# Patient Record
Sex: Male | Born: 1945 | Race: Black or African American | Hispanic: No
Health system: Southern US, Community
[De-identification: ages and names within clinical notes are randomized; demographics above are authoritative.]

## PROBLEM LIST (undated history)

## (undated) DIAGNOSIS — J9621 Acute and chronic respiratory failure with hypoxia: Secondary | ICD-10-CM

## (undated) DIAGNOSIS — R6520 Severe sepsis without septic shock: Secondary | ICD-10-CM

## (undated) DIAGNOSIS — Z930 Tracheostomy status: Secondary | ICD-10-CM

## (undated) DIAGNOSIS — G8251 Quadriplegia, C1-C4 complete: Secondary | ICD-10-CM

## (undated) DIAGNOSIS — A419 Sepsis, unspecified organism: Secondary | ICD-10-CM

---

## 2020-09-08 ENCOUNTER — Inpatient Hospital Stay
Admission: RE | Admit: 2020-09-08 | Discharge: 2020-10-27 | Disposition: A | Discharge status: Skilled Nursing Facility | Payer: Medicare Other | Source: Other Acute Inpatient Hospital | Attending: Internal Medicine | Admitting: Internal Medicine

## 2020-09-08 ENCOUNTER — Other Ambulatory Visit (HOSPITAL_COMMUNITY): Payer: Medicare Other

## 2020-09-08 DIAGNOSIS — G8251 Quadriplegia, C1-C4 complete: Secondary | ICD-10-CM | POA: Diagnosis present

## 2020-09-08 DIAGNOSIS — A419 Sepsis, unspecified organism: Secondary | ICD-10-CM | POA: Diagnosis present

## 2020-09-08 DIAGNOSIS — Z930 Tracheostomy status: Secondary | ICD-10-CM

## 2020-09-08 DIAGNOSIS — J9690 Respiratory failure, unspecified, unspecified whether with hypoxia or hypercapnia: Secondary | ICD-10-CM

## 2020-09-08 DIAGNOSIS — J9621 Acute and chronic respiratory failure with hypoxia: Secondary | ICD-10-CM | POA: Diagnosis present

## 2020-09-08 DIAGNOSIS — R6520 Severe sepsis without septic shock: Secondary | ICD-10-CM | POA: Diagnosis present

## 2020-09-08 HISTORY — DX: Sepsis, unspecified organism: A41.9

## 2020-09-08 HISTORY — DX: Quadriplegia, C1-C4 complete: G82.51

## 2020-09-08 HISTORY — DX: Sepsis, unspecified organism: R65.20

## 2020-09-08 HISTORY — DX: Acute and chronic respiratory failure with hypoxia: J96.21

## 2020-09-08 HISTORY — DX: Tracheostomy status: Z93.0

## 2020-09-08 MED ORDER — IOHEXOL 300 MG/ML  SOLN
50.0000 mL | Freq: Once | INTRAMUSCULAR | Status: AC | PRN
  Administered 2020-09-08: 50 mL

## 2020-09-09 DIAGNOSIS — G8251 Quadriplegia, C1-C4 complete: Secondary | ICD-10-CM | POA: Diagnosis not present

## 2020-09-09 DIAGNOSIS — A419 Sepsis, unspecified organism: Secondary | ICD-10-CM

## 2020-09-09 DIAGNOSIS — J9621 Acute and chronic respiratory failure with hypoxia: Secondary | ICD-10-CM | POA: Diagnosis not present

## 2020-09-09 DIAGNOSIS — Z930 Tracheostomy status: Secondary | ICD-10-CM

## 2020-09-09 DIAGNOSIS — R6520 Severe sepsis without septic shock: Secondary | ICD-10-CM

## 2020-09-09 LAB — BLOOD GAS, ARTERIAL
Acid-Base Excess: 3.4 mmol/L — ABNORMAL HIGH (ref 0.0–2.0)
Bicarbonate: 27 mmol/L (ref 20.0–28.0)
FIO2: 28
O2 Saturation: 96.2 %
Patient temperature: 36
pCO2 arterial: 36.3 mmHg (ref 32.0–48.0)
pH, Arterial: 7.479 — ABNORMAL HIGH (ref 7.350–7.450)
pO2, Arterial: 75.6 mmHg — ABNORMAL LOW (ref 83.0–108.0)

## 2020-09-09 LAB — COMPREHENSIVE METABOLIC PANEL
ALT: 18 U/L (ref 0–44)
AST: 26 U/L (ref 15–41)
Albumin: 1.2 g/dL — ABNORMAL LOW (ref 3.5–5.0)
Alkaline Phosphatase: 72 U/L (ref 38–126)
Anion gap: 7 (ref 5–15)
BUN: 12 mg/dL (ref 8–23)
CO2: 26 mmol/L (ref 22–32)
Calcium: 7.9 mg/dL — ABNORMAL LOW (ref 8.9–10.3)
Chloride: 100 mmol/L (ref 98–111)
Creatinine, Ser: 0.5 mg/dL — ABNORMAL LOW (ref 0.61–1.24)
GFR, Estimated: >60 mL/min (ref >60)
Glucose, Bld: 127 mg/dL — ABNORMAL HIGH (ref 70–99)
Potassium: 3.7 mmol/L (ref 3.5–5.1)
Sodium: 133 mmol/L — ABNORMAL LOW (ref 135–145)
Total Bilirubin: 0.6 mg/dL (ref 0.3–1.2)
Total Protein: 5.7 g/dL — ABNORMAL LOW (ref 6.5–8.1)

## 2020-09-09 LAB — CBC
HCT: 29.9 % — ABNORMAL LOW (ref 39.0–52.0)
Hemoglobin: 9.3 g/dL — ABNORMAL LOW (ref 13.0–17.0)
MCH: 23.4 pg — ABNORMAL LOW (ref 26.0–34.0)
MCHC: 31.1 g/dL (ref 30.0–36.0)
MCV: 75.1 fL — ABNORMAL LOW (ref 80.0–100.0)
Platelets: 283 10*3/uL (ref 150–400)
RBC: 3.98 MIL/uL — ABNORMAL LOW (ref 4.22–5.81)
RDW: 18.9 % — ABNORMAL HIGH (ref 11.5–15.5)
WBC: 7.1 10*3/uL (ref 4.0–10.5)
nRBC: 0 % (ref 0.0–0.2)

## 2020-09-09 LAB — URINALYSIS, ROUTINE W REFLEX MICROSCOPIC
Bilirubin Urine: NEGATIVE
Glucose, UA: NEGATIVE mg/dL
Ketones, ur: NEGATIVE mg/dL
Nitrite: NEGATIVE
Protein, ur: NEGATIVE mg/dL
Specific Gravity, Urine: 1.02 (ref 1.005–1.030)
pH: 8 (ref 5.0–8.0)

## 2020-09-09 LAB — URINALYSIS, MICROSCOPIC (REFLEX): Squamous Epithelial / HPF: NONE SEEN (ref 0–5)

## 2020-09-09 LAB — PROTIME-INR
INR: 1.3 — ABNORMAL HIGH (ref 0.8–1.2)
Prothrombin Time: 15.6 seconds — ABNORMAL HIGH (ref 11.4–15.2)

## 2020-09-09 LAB — TSH: TSH: 7.435 u[IU]/mL — ABNORMAL HIGH (ref 0.350–4.500)

## 2020-09-09 LAB — APTT: aPTT: 38 seconds — ABNORMAL HIGH (ref 24–36)

## 2020-09-09 NOTE — Consult Note (Signed)
Pulmonary Critical Care Medicine [facility] [place]  PULMONARY SERVICE  Date of Service: 09/09/2020  PULMONARY CRITICAL CARE CONSULT   Anthony Melton  EBX:[id number]  DOB: Jul 25, 1946   DOA: 09/08/2020  Referring Physician: Carron Curie, MD  HPI: Anthony Melton is a 75 y.o. male seen for follow up of Acute on Chronic Respiratory Failure.  Patient has multiple medical problems including hyperlipidemia hypertension came into the hospital because of weakness and shortness of breath.  The patient had blood cultures at that time which were positive and so was started on vancomycin and cefepime.  Respiratory status continued to decline ended up intubated on mechanical ventilation.  Patient was found to have an epidural abscess and patient underwent a laminectomy of the cervical spine with removal of the abscess back in January.  Subsequently patient was not able to wean off the ventilator and had to have a tracheostomy done.  Subsequently also patient had a PEG tube for anticipated long-term feeding.  Now transferred to our facility for further management  Review of Systems:  ROS performed and is unremarkable other than noted above.  Past medical history: Hypertension Hyperlipidemia Epidural abscess Quadriplegia Dysphagia   Past surgical history: Tracheostomy PEG  Family History: Non-Contributory to the present illness    Medications: Reviewed on Rounds  Physical Exam:  Vitals: Temperature is 96.1 pulse 58 respiratory 18 blood pressure is 107/69 saturations 97%  Ventilator Settings on T collar with an FiO2 of 28%  . General: Comfortable at this time . Eyes: Grossly normal lids, irises & conjunctiva . ENT: grossly tongue is normal . Neck: no obvious mass . Cardiovascular: S1-S2 normal no gallop or rub . Respiratory: Coarse breath sounds with few scattered rhonchi . Abdomen: Soft nontender . Skin: no rash seen on limited exam . Musculoskeletal: not  rigid . Psychiatric:unable to assess . Neurologic: no seizure no involuntary movements         Labs on Admission:  Basic Metabolic Panel: Recent Labs  Lab 09/09/20 0546  NA 133*  K 3.7  CL 100  CO2 26  GLUCOSE 127*  BUN 12  CREATININE 0.50*  CALCIUM 7.9*    Recent Labs  Lab 09/09/20 0845  PHART 7.479*  PCO2ART 36.3  PO2ART 75.6*  HCO3 27.0  O2SAT 96.2    Liver Function Tests: Recent Labs  Lab 09/09/20 0546  AST 26  ALT 18  ALKPHOS 72  BILITOT 0.6  PROT 5.7*  ALBUMIN 1.2*   No results for input(s): LIPASE, AMYLASE in the last 168 hours. No results for input(s): AMMONIA in the last 168 hours.  CBC: Recent Labs  Lab 09/09/20 0546  WBC 7.1  HGB 9.3*  HCT 29.9*  MCV 75.1*  PLT 283    Cardiac Enzymes: No results for input(s): CKTOTAL, CKMB, CKMBINDEX, TROPONINI in the last 168 hours.  BNP (last 3 results) No results for input(s): BNP in the last 8760 hours.  ProBNP (last 3 results) No results for input(s): PROBNP in the last 8760 hours.   Radiological Exams on Admission: DG ABDOMEN PEG TUBE LOCATION  Result Date: 09/08/2020 CLINICAL DATA:  Peg placement. EXAM: ABDOMEN - 1 VIEW COMPARISON:  None. FINDINGS: Portable AP supine view of the abdomen obtained after the installation of contrast through indwelling gastrostomy tube. Type and amount of contrast not specified. Contrast opacifies the gastrostomy tubing and stomach. Contrast does not extend into the duodenum or small bowel. There is no evidence of extravasation or leak. Air within nondilated bowel throughout  the central abdomen. Foley catheter versus rectal tube projects over the pelvis. IMPRESSION: Gastrostomy tube in the stomach. No evidence of extravasation or leak. Electronically Signed   By: Narda Rutherford M.D.   On: 09/08/2020 22:42   DG CHEST PORT 1 VIEW  Result Date: 09/08/2020 CLINICAL DATA:  New admission. No additional history provided or available. EXAM: PORTABLE CHEST 1 VIEW  COMPARISON:  None. FINDINGS: Tracheostomy tube tip at the thoracic inlet. There is a left upper extremity PICC in place. The tip is looped in the region of the brachiocephalic confluence. Overall low lung volumes. Cardiomegaly. Calcified mediastinal and left hilar nodes. Calcified granuloma at the left lung base. Hazy bilateral lung base opacities likely combination of pleural effusions and airspace disease and/or atelectasis. No pneumothorax. Thoracic spondylosis. IMPRESSION: 1. Tracheostomy tube tip at the thoracic inlet. 2. Left upper extremity PICC in place, tip looped in the region of the brachiocephalic confluence. 3. Cardiomegaly with hazy bilateral lung base opacities likely combination of pleural effusions and airspace disease and/or atelectasis. Electronically Signed   By: Narda Rutherford M.D.   On: 09/08/2020 22:41    Assessment/Plan Active Problems:   Acute on chronic respiratory failure with hypoxia (HCC)   Severe sepsis (HCC)   Quadriplegia, C1-C4 complete (HCC)   Tracheostomy status (HCC)   1. Acute on chronic respiratory failure hypoxia we will continue with T collar trials patient currently is on 28% FiO2 secretions refer to monitor patient's doing well.  Right now the patient had a #10 trach in place ABG that was done looks good spoke with respiratory therapy to go ahead and proceed to downsize the tracheostomy.  The patient had been apparently remain off the ventilator at the other facility so I think they will go ahead and do 24-hour 2. Severe sepsis patient has diagnosis of epidural abscess was treated with antibiotics and patient is supposed to go through March 25 for antibiotics. 3. Quadriplegia supportive care therapy as tolerated patient will require therapy. 4. Tracheostomy status with patient's quadriplegia unclear right now whether we will be able to decannulate  I have personally seen and evaluated the patient, evaluated laboratory and imaging results, formulated the  assessment and plan and placed orders. The Patient requires high complexity decision making with multiple systems involvement.  Case was discussed on Rounds with the Respiratory Therapy Director and the Respiratory staff Time Spent  Yevonne Pax, MD [facility] Pulmonary Critical Care Medicine Sleep Medicine

## 2020-09-10 DIAGNOSIS — Z930 Tracheostomy status: Secondary | ICD-10-CM | POA: Diagnosis not present

## 2020-09-10 DIAGNOSIS — J9621 Acute and chronic respiratory failure with hypoxia: Secondary | ICD-10-CM | POA: Diagnosis not present

## 2020-09-10 DIAGNOSIS — A419 Sepsis, unspecified organism: Secondary | ICD-10-CM | POA: Diagnosis not present

## 2020-09-10 DIAGNOSIS — G8251 Quadriplegia, C1-C4 complete: Secondary | ICD-10-CM | POA: Diagnosis not present

## 2020-09-10 LAB — BASIC METABOLIC PANEL
Anion gap: 9 (ref 5–15)
BUN: 12 mg/dL (ref 8–23)
CO2: 26 mmol/L (ref 22–32)
Calcium: 8 mg/dL — ABNORMAL LOW (ref 8.9–10.3)
Chloride: 100 mmol/L (ref 98–111)
Creatinine, Ser: 0.58 mg/dL — ABNORMAL LOW (ref 0.61–1.24)
GFR, Estimated: >60 mL/min (ref >60)
Glucose, Bld: 107 mg/dL — ABNORMAL HIGH (ref 70–99)
Potassium: 3.6 mmol/L (ref 3.5–5.1)
Sodium: 135 mmol/L (ref 135–145)

## 2020-09-10 NOTE — Progress Notes (Signed)
Pulmonary Critical Care Medicine [facility] [place]   PULMONARY CRITICAL CARE SERVICE  PROGRESS NOTE  Date of Service: 09/10/2020  Anthony Melton  OMB:[id number]  DOB: 1946-04-14   DOA: 09/08/2020  Referring Physician: Carron Curie, MD  HPI: Anthony Melton is a 75 y.o. male seen for follow up of Acute on Chronic Respiratory Failure.  Patient is on T collar has been on 28% FiO2 good saturations are noted at this time.  Medications: Reviewed on Rounds  Physical Exam:  Vitals: Temperature is 97.4 pulse 70 respiratory 25 blood pressure is 93/61 saturations 97%  Ventilator Settings on T collar with an FiO2 28%  . General: Comfortable at this time . Eyes: Grossly normal lids, irises & conjunctiva . ENT: grossly tongue is normal . Neck: no obvious mass . Cardiovascular: S1 S2 normal no gallop . Respiratory: Coarse breath sounds is few few rhonchi . Abdomen: soft . Skin: no rash seen on limited exam . Musculoskeletal: not rigid . Psychiatric:unable to assess . Neurologic: no seizure no involuntary movements         Lab Data:   Basic Metabolic Panel: Recent Labs  Lab 09/09/20 0546 09/10/20 0408  NA 133* 135  K 3.7 3.6  CL 100 100  CO2 26 26  GLUCOSE 127* 107*  BUN 12 12  CREATININE 0.50* 0.58*  CALCIUM 7.9* 8.0*    ABG: Recent Labs  Lab 09/09/20 0845  PHART 7.479*  PCO2ART 36.3  PO2ART 75.6*  HCO3 27.0  O2SAT 96.2    Liver Function Tests: Recent Labs  Lab 09/09/20 0546  AST 26  ALT 18  ALKPHOS 72  BILITOT 0.6  PROT 5.7*  ALBUMIN 1.2*   No results for input(s): LIPASE, AMYLASE in the last 168 hours. No results for input(s): AMMONIA in the last 168 hours.  CBC: Recent Labs  Lab 09/09/20 0546  WBC 7.1  HGB 9.3*  HCT 29.9*  MCV 75.1*  PLT 283    Cardiac Enzymes: No results for input(s): CKTOTAL, CKMB, CKMBINDEX, TROPONINI in the last 168 hours.  BNP (last 3 results) No results for input(s): BNP in the last 8760  hours.  ProBNP (last 3 results) No results for input(s): PROBNP in the last 8760 hours.  Radiological Exams: DG ABDOMEN PEG TUBE LOCATION  Result Date: 09/08/2020 CLINICAL DATA:  Peg placement. EXAM: ABDOMEN - 1 VIEW COMPARISON:  None. FINDINGS: Portable AP supine view of the abdomen obtained after the installation of contrast through indwelling gastrostomy tube. Type and amount of contrast not specified. Contrast opacifies the gastrostomy tubing and stomach. Contrast does not extend into the duodenum or small bowel. There is no evidence of extravasation or leak. Air within nondilated bowel throughout the central abdomen. Foley catheter versus rectal tube projects over the pelvis. IMPRESSION: Gastrostomy tube in the stomach. No evidence of extravasation or leak. Electronically Signed   By: Narda Rutherford M.D.   On: 09/08/2020 22:42   DG CHEST PORT 1 VIEW  Result Date: 09/08/2020 CLINICAL DATA:  New admission. No additional history provided or available. EXAM: PORTABLE CHEST 1 VIEW COMPARISON:  None. FINDINGS: Tracheostomy tube tip at the thoracic inlet. There is a left upper extremity PICC in place. The tip is looped in the region of the brachiocephalic confluence. Overall low lung volumes. Cardiomegaly. Calcified mediastinal and left hilar nodes. Calcified granuloma at the left lung base. Hazy bilateral lung base opacities likely combination of pleural effusions and airspace disease and/or atelectasis. No pneumothorax. Thoracic spondylosis. IMPRESSION: 1.  Tracheostomy tube tip at the thoracic inlet. 2. Left upper extremity PICC in place, tip looped in the region of the brachiocephalic confluence. 3. Cardiomegaly with hazy bilateral lung base opacities likely combination of pleural effusions and airspace disease and/or atelectasis. Electronically Signed   By: Narda Rutherford M.D.   On: 09/08/2020 22:41    Assessment/Plan Active Problems:   Acute on chronic respiratory failure with hypoxia (HCC)    Severe sepsis (HCC)   Quadriplegia, C1-C4 complete (HCC)   Tracheostomy status (HCC)   1. Acute on chronic respiratory failure hypoxia we will continue with T collar titrate oxygen continue pulmonary toilet. 2. Severe sepsis treated patient is going to be on long-term antibiotics 3. Quadriplegia supportive care therapy as tolerated 4. Tracheostomy will remain in place for now   I have personally seen and evaluated the patient, evaluated laboratory and imaging results, formulated the assessment and plan and placed orders. The Patient requires high complexity decision making with multiple systems involvement.  Rounds were done with the Respiratory Therapy Director and Staff therapists and discussed with nursing staff also.  Yevonne Pax, MD [facility] Pulmonary Critical Care Medicine Sleep Medicine

## 2020-09-11 DIAGNOSIS — G8251 Quadriplegia, C1-C4 complete: Secondary | ICD-10-CM | POA: Diagnosis not present

## 2020-09-11 DIAGNOSIS — J9621 Acute and chronic respiratory failure with hypoxia: Secondary | ICD-10-CM | POA: Diagnosis not present

## 2020-09-11 DIAGNOSIS — Z930 Tracheostomy status: Secondary | ICD-10-CM | POA: Diagnosis not present

## 2020-09-11 DIAGNOSIS — A419 Sepsis, unspecified organism: Secondary | ICD-10-CM | POA: Diagnosis not present

## 2020-09-11 LAB — CK: Total CK: 138 U/L (ref 49–397)

## 2020-09-11 NOTE — Progress Notes (Signed)
Pulmonary Critical Care Medicine [facility] [place]   PULMONARY CRITICAL CARE SERVICE  PROGRESS NOTE  Date of Service: 09/11/2020  Anthony Melton  QIW:[id number]  DOB: 1945-08-24   DOA: 09/08/2020  Referring Physician: Carron Curie, MD  HPI: Anthony Melton is a 75 y.o. male seen for follow up of Acute on Chronic Respiratory Failure.  Patient right now is on T collar has been on 28% FiO2.  Medications: Reviewed on Rounds  Physical Exam:  Vitals: Temperature is 97.8 pulse 61 respiratory rate 20 blood pressure is 124/65 saturations 98%  Ventilator Settings on T collar with an FiO2 28%  . General: Comfortable at this time . Eyes: Grossly normal lids, irises & conjunctiva . ENT: grossly tongue is normal . Neck: no obvious mass . Cardiovascular: S1 S2 normal no gallop . Respiratory: Scattered rhonchi no rales . Abdomen: soft . Skin: no rash seen on limited exam . Musculoskeletal: not rigid . Psychiatric:unable to assess . Neurologic: no seizure no involuntary movements         Lab Data:   Basic Metabolic Panel: Recent Labs  Lab 09/09/20 0546 09/10/20 0408  NA 133* 135  K 3.7 3.6  CL 100 100  CO2 26 26  GLUCOSE 127* 107*  BUN 12 12  CREATININE 0.50* 0.58*  CALCIUM 7.9* 8.0*    ABG: Recent Labs  Lab 09/09/20 0845  PHART 7.479*  PCO2ART 36.3  PO2ART 75.6*  HCO3 27.0  O2SAT 96.2    Liver Function Tests: Recent Labs  Lab 09/09/20 0546  AST 26  ALT 18  ALKPHOS 72  BILITOT 0.6  PROT 5.7*  ALBUMIN 1.2*   No results for input(s): LIPASE, AMYLASE in the last 168 hours. No results for input(s): AMMONIA in the last 168 hours.  CBC: Recent Labs  Lab 09/09/20 0546  WBC 7.1  HGB 9.3*  HCT 29.9*  MCV 75.1*  PLT 283    Cardiac Enzymes: No results for input(s): CKTOTAL, CKMB, CKMBINDEX, TROPONINI in the last 168 hours.  BNP (last 3 results) No results for input(s): BNP in the last 8760 hours.  ProBNP (last 3 results) No results  for input(s): PROBNP in the last 8760 hours.  Radiological Exams: No results found.  Assessment/Plan Active Problems:   Acute on chronic respiratory failure with hypoxia (HCC)   Severe sepsis (HCC)   Quadriplegia, C1-C4 complete (HCC)   Tracheostomy status (HCC)   1. Acute on chronic respiratory failure with hypoxia we will continue T collar trials titrate oxygen as tolerated continue secretion management pulmonary toilet. 2. Severe sepsis treated in resolution 3. Quadriplegia supportive care 4. Tracheostomy will remain in place for secretion management for now   I have personally seen and evaluated the patient, evaluated laboratory and imaging results, formulated the assessment and plan and placed orders. The Patient requires high complexity decision making with multiple systems involvement.  Rounds were done with the Respiratory Therapy Director and Staff therapists and discussed with nursing staff also.  Yevonne Pax, MD [facility] Pulmonary Critical Care Medicine Sleep Medicine

## 2020-09-12 DIAGNOSIS — G8251 Quadriplegia, C1-C4 complete: Secondary | ICD-10-CM | POA: Diagnosis not present

## 2020-09-12 DIAGNOSIS — A419 Sepsis, unspecified organism: Secondary | ICD-10-CM | POA: Diagnosis not present

## 2020-09-12 DIAGNOSIS — J9621 Acute and chronic respiratory failure with hypoxia: Secondary | ICD-10-CM | POA: Diagnosis not present

## 2020-09-12 DIAGNOSIS — Z930 Tracheostomy status: Secondary | ICD-10-CM | POA: Diagnosis not present

## 2020-09-12 NOTE — Progress Notes (Signed)
Pulmonary Critical Care Medicine [facility] [place]   PULMONARY CRITICAL CARE SERVICE  PROGRESS NOTE  Date of Service: 09/12/2020  Anthony Melton  [id number]  DOB: 12/13/1945   DOA: 09/08/2020  Referring Physician: Carron Curie, MD  HPI: Anthony Melton is a 75 y.o. male seen for follow up of Acute on Chronic Respiratory Failure.  Patient currently is on T collar has been on 28% FiO2 at this time  Medications: Reviewed on Rounds  Physical Exam:  Vitals: Temperature is 98.0 pulse 73 respiratory rate is 31 blood pressure is 102/62 saturations 98%  Ventilator Settings on T collar with an FiO2 of 35%  . General: Comfortable at this time . Eyes: Grossly normal lids, irises & conjunctiva . ENT: grossly tongue is normal . Neck: no obvious mass . Cardiovascular: S1 S2 normal no gallop . Respiratory: Scattered rhonchi expansion is equal at this time . Abdomen: soft . Skin: no rash seen on limited exam . Musculoskeletal: not rigid . Psychiatric:unable to assess . Neurologic: no seizure no involuntary movements         Lab Data:   Basic Metabolic Panel: Recent Labs  Lab 09/09/20 0546 09/10/20 0408  NA 133* 135  K 3.7 3.6  CL 100 100  CO2 26 26  GLUCOSE 127* 107*  BUN 12 12  CREATININE 0.50* 0.58*  CALCIUM 7.9* 8.0*    ABG: Recent Labs  Lab 09/09/20 0845  PHART 7.479*  PCO2ART 36.3  PO2ART 75.6*  HCO3 27.0  O2SAT 96.2    Liver Function Tests: Recent Labs  Lab 09/09/20 0546  AST 26  ALT 18  ALKPHOS 72  BILITOT 0.6  PROT 5.7*  ALBUMIN 1.2*   No results for input(s): LIPASE, AMYLASE in the last 168 hours. No results for input(s): AMMONIA in the last 168 hours.  CBC: Recent Labs  Lab 09/09/20 0546  WBC 7.1  HGB 9.3*  HCT 29.9*  MCV 75.1*  PLT 283    Cardiac Enzymes: Recent Labs  Lab 09/11/20 0954  CKTOTAL 138    BNP (last 3 results) No results for input(s): BNP in the last 8760 hours.  ProBNP (last 3 results) No  results for input(s): PROBNP in the last 8760 hours.  Radiological Exams: No results found.  Assessment/Plan Active Problems:   Acute on chronic respiratory failure with hypoxia (HCC)   Severe sepsis (HCC)   Quadriplegia, C1-C4 complete (HCC)   Tracheostomy status (HCC)   1. Acute on chronic respiratory failure hypoxia plan is to continue with the weaning on T collar.  Titrate oxygen continue pulmonary toilet as tolerated. 2. Quadriplegia therapy as tolerated. 3. Severe sepsis treated in resolution long-term antibiotics planned 4. Tracheostomy will remain in place   I have personally seen and evaluated the patient, evaluated laboratory and imaging results, formulated the assessment and plan and placed orders. The Patient requires high complexity decision making with multiple systems involvement.  Rounds were done with the Respiratory Therapy Director and Staff therapists and discussed with nursing staff also.  Yevonne Pax, MD [facility] Pulmonary Critical Care Medicine Sleep Medicine

## 2020-09-13 DIAGNOSIS — Z930 Tracheostomy status: Secondary | ICD-10-CM | POA: Diagnosis not present

## 2020-09-13 DIAGNOSIS — J9621 Acute and chronic respiratory failure with hypoxia: Secondary | ICD-10-CM | POA: Diagnosis not present

## 2020-09-13 DIAGNOSIS — A419 Sepsis, unspecified organism: Secondary | ICD-10-CM | POA: Diagnosis not present

## 2020-09-13 DIAGNOSIS — G8251 Quadriplegia, C1-C4 complete: Secondary | ICD-10-CM | POA: Diagnosis not present

## 2020-09-13 NOTE — Progress Notes (Signed)
Pulmonary Critical Care Medicine [facility] [place]   PULMONARY CRITICAL CARE SERVICE  PROGRESS NOTE  Date of Service: 09/13/2020  Anthony Melton  VOZ:[id number]  DOB: Nov 06, 1945   DOA: 09/08/2020  Referring Physician: Carron Curie, MD  HPI: Anthony Melton is a 75 y.o. male seen for follow up of Acute on Chronic Respiratory Failure.  Patient currently is on T collar has been on 28% FiO2 with good saturations.  Medications: Reviewed on Rounds  Physical Exam:  Vitals: Temperature is 98.2 pulse 69 respiratory 24 blood pressure is 124/92 saturations 100%  Ventilator Settings on T collar with an FiO2 28%  . General: Comfortable at this time . Eyes: Grossly normal lids, irises & conjunctiva . ENT: grossly tongue is normal . Neck: no obvious mass . Cardiovascular: S1 S2 normal no gallop . Respiratory: Coarse rhonchi expansion is equal . Abdomen: soft . Skin: no rash seen on limited exam . Musculoskeletal: not rigid . Psychiatric:unable to assess . Neurologic: no seizure no involuntary movements         Lab Data:   Basic Metabolic Panel: Recent Labs  Lab 09/09/20 0546 09/10/20 0408  NA 133* 135  K 3.7 3.6  CL 100 100  CO2 26 26  GLUCOSE 127* 107*  BUN 12 12  CREATININE 0.50* 0.58*  CALCIUM 7.9* 8.0*    ABG: Recent Labs  Lab 09/09/20 0845  PHART 7.479*  PCO2ART 36.3  PO2ART 75.6*  HCO3 27.0  O2SAT 96.2    Liver Function Tests: Recent Labs  Lab 09/09/20 0546  AST 26  ALT 18  ALKPHOS 72  BILITOT 0.6  PROT 5.7*  ALBUMIN 1.2*   No results for input(s): LIPASE, AMYLASE in the last 168 hours. No results for input(s): AMMONIA in the last 168 hours.  CBC: Recent Labs  Lab 09/09/20 0546  WBC 7.1  HGB 9.3*  HCT 29.9*  MCV 75.1*  PLT 283    Cardiac Enzymes: Recent Labs  Lab 09/11/20 0954  CKTOTAL 138    BNP (last 3 results) No results for input(s): BNP in the last 8760 hours.  ProBNP (last 3 results) No results for  input(s): PROBNP in the last 8760 hours.  Radiological Exams: No results found.  Assessment/Plan Active Problems:   Acute on chronic respiratory failure with hypoxia (HCC)   Severe sepsis (HCC)   Quadriplegia, C1-C4 complete (HCC)   Tracheostomy status (HCC)   1. Acute on chronic respiratory failure hypoxia we will continue with T collar trials titrate oxygen continue pulmonary toilet secretions are fair to moderate 2. Severe sepsis treated in resolution 3. Tracheostomy remains in place 4. Quadriplegia supportive care   I have personally seen and evaluated the patient, evaluated laboratory and imaging results, formulated the assessment and plan and placed orders. The Patient requires high complexity decision making with multiple systems involvement.  Rounds were done with the Respiratory Therapy Director and Staff therapists and discussed with nursing staff also.  Yevonne Pax, MD [facility] Pulmonary Critical Care Medicine Sleep Medicine

## 2020-09-14 DIAGNOSIS — J9621 Acute and chronic respiratory failure with hypoxia: Secondary | ICD-10-CM | POA: Diagnosis not present

## 2020-09-14 DIAGNOSIS — A419 Sepsis, unspecified organism: Secondary | ICD-10-CM | POA: Diagnosis not present

## 2020-09-14 DIAGNOSIS — G8251 Quadriplegia, C1-C4 complete: Secondary | ICD-10-CM | POA: Diagnosis not present

## 2020-09-14 DIAGNOSIS — Z930 Tracheostomy status: Secondary | ICD-10-CM | POA: Diagnosis not present

## 2020-09-14 NOTE — Progress Notes (Signed)
Pulmonary Critical Care Medicine [facility] [place]   PULMONARY CRITICAL CARE SERVICE  PROGRESS NOTE  Date of Service: 09/14/2020  Hector Taft  UYQ:[id number]  DOB: 1945/11/06   DOA: 09/08/2020  Referring Physician: Carron Curie, MD  HPI: Emran Molzahn is a 75 y.o. male seen for follow up of Acute on Chronic Respiratory Failure.  Patient at this time is on T collar copious secretions are noted limiting [place] from being able to advance weaning  Medications: Reviewed on Rounds  Physical Exam:  Vitals: Temperature is 96.9 pulse 59 respiratory 21 blood pressure is 103/58 saturations 100%  Ventilator Settings on T collar with an FiO2 of 28%  . General: Comfortable at this time . Eyes: Grossly normal lids, irises & conjunctiva . ENT: grossly tongue is normal . Neck: no obvious mass . Cardiovascular: S1 S2 normal no gallop . Respiratory: Scattered rhonchi expansion is equal . Abdomen: soft . Skin: no rash seen on limited exam . Musculoskeletal: not rigid . Psychiatric:unable to assess . Neurologic: no seizure no involuntary movements         Lab Data:   Basic Metabolic Panel: Recent Labs  Lab 09/09/20 0546 09/10/20 0408  NA 133* 135  K 3.7 3.6  CL 100 100  CO2 26 26  GLUCOSE 127* 107*  BUN 12 12  CREATININE 0.50* 0.58*  CALCIUM 7.9* 8.0*    ABG: Recent Labs  Lab 09/09/20 0845  PHART 7.479*  PCO2ART 36.3  PO2ART 75.6*  HCO3 27.0  O2SAT 96.2    Liver Function Tests: Recent Labs  Lab 09/09/20 0546  AST 26  ALT 18  ALKPHOS 72  BILITOT 0.6  PROT 5.7*  ALBUMIN 1.2*   No results for input(s): LIPASE, AMYLASE in the last 168 hours. No results for input(s): AMMONIA in the last 168 hours.  CBC: Recent Labs  Lab 09/09/20 0546  WBC 7.1  HGB 9.3*  HCT 29.9*  MCV 75.1*  PLT 283    Cardiac Enzymes: Recent Labs  Lab 09/11/20 0954  CKTOTAL 138    BNP (last 3 results) No results for input(s): BNP in the last 8760  hours.  ProBNP (last 3 results) No results for input(s): PROBNP in the last 8760 hours.  Radiological Exams: No results found.  Assessment/Plan Active Problems:   Acute on chronic respiratory failure with hypoxia (HCC)   Severe sepsis (HCC)   Quadriplegia, C1-C4 complete (HCC)   Tracheostomy status (HCC)   1. Acute on chronic respiratory failure hypoxia patient is on T collar copious secretions right and requiring 28% FiO2. 2. Severe sepsis resolved patient will remain on long-term antibiotics through March 25 3. Quadriplegia no change we will continue to follow 4. Tracheostomy remains in place   I have personally seen and evaluated the patient, evaluated laboratory and imaging results, formulated the assessment and plan and placed orders. The Patient requires high complexity decision making with multiple systems involvement.  Rounds were done with the Respiratory Therapy Director and Staff therapists and discussed with nursing staff also.  Yevonne Pax, MD [facility] Pulmonary Critical Care Medicine Sleep Medicine

## 2020-09-15 ENCOUNTER — Encounter: Payer: Self-pay | Admitting: Internal Medicine

## 2020-09-15 ENCOUNTER — Other Ambulatory Visit (HOSPITAL_COMMUNITY): Payer: Medicare Other

## 2020-09-15 DIAGNOSIS — J9621 Acute and chronic respiratory failure with hypoxia: Secondary | ICD-10-CM | POA: Diagnosis present

## 2020-09-15 DIAGNOSIS — R6520 Severe sepsis without septic shock: Secondary | ICD-10-CM | POA: Diagnosis present

## 2020-09-15 DIAGNOSIS — G8251 Quadriplegia, C1-C4 complete: Secondary | ICD-10-CM | POA: Diagnosis not present

## 2020-09-15 DIAGNOSIS — Z930 Tracheostomy status: Secondary | ICD-10-CM | POA: Diagnosis not present

## 2020-09-15 DIAGNOSIS — A419 Sepsis, unspecified organism: Secondary | ICD-10-CM | POA: Diagnosis not present

## 2020-09-15 LAB — BASIC METABOLIC PANEL
Anion gap: 9 (ref 5–15)
BUN: 16 mg/dL (ref 8–23)
CO2: 27 mmol/L (ref 22–32)
Calcium: 8.6 mg/dL — ABNORMAL LOW (ref 8.9–10.3)
Chloride: 102 mmol/L (ref 98–111)
Creatinine, Ser: 0.51 mg/dL — ABNORMAL LOW (ref 0.61–1.24)
GFR, Estimated: >60 mL/min (ref >60)
Glucose, Bld: 132 mg/dL — ABNORMAL HIGH (ref 70–99)
Potassium: 3.4 mmol/L — ABNORMAL LOW (ref 3.5–5.1)
Sodium: 138 mmol/L (ref 135–145)

## 2020-09-15 LAB — CBC
HCT: 32.7 % — ABNORMAL LOW (ref 39.0–52.0)
Hemoglobin: 10.1 g/dL — ABNORMAL LOW (ref 13.0–17.0)
MCH: 23.4 pg — ABNORMAL LOW (ref 26.0–34.0)
MCHC: 30.9 g/dL (ref 30.0–36.0)
MCV: 75.9 fL — ABNORMAL LOW (ref 80.0–100.0)
Platelets: 253 10*3/uL (ref 150–400)
RBC: 4.31 MIL/uL (ref 4.22–5.81)
RDW: 20 % — ABNORMAL HIGH (ref 11.5–15.5)
WBC: 7.4 10*3/uL (ref 4.0–10.5)
nRBC: 0 % (ref 0.0–0.2)

## 2020-09-15 NOTE — Progress Notes (Signed)
Pulmonary Critical Care Medicine [facility] [place]   PULMONARY CRITICAL CARE SERVICE  PROGRESS NOTE  Date of Service: 09/15/2020  Anthony Melton  [id number]:[id number]  DOB: January 03, 1946   DOA: 09/08/2020  Referring Physician: Carron Curie, MD  HPI: Anthony Melton is a 75 y.o. male seen for follow up of Acute on Chronic Respiratory Failure.  Patient currently is on T collar 28% FiO2 seems to be doing well secretions are copious  Medications: Reviewed on Rounds  Physical Exam:  Vitals: Temperature is 95.6 pulse 52 respiratory 20 blood pressure is 137/85 saturations 99%  Ventilator Settings on T collar with an FiO2 of 28%  . General: Comfortable at this time . Eyes: Grossly normal lids, irises & conjunctiva . ENT: grossly tongue is normal . Neck: no obvious mass . Cardiovascular: S1 S2 normal no gallop . Respiratory: Scattered rhonchi expansion is equal . Abdomen: soft . Skin: no rash seen on limited exam . Musculoskeletal: not rigid . Psychiatric:unable to assess . Neurologic: no seizure no involuntary movements         Lab Data:   Basic Metabolic Panel: Recent Labs  Lab 09/09/20 0546 09/10/20 0408 09/15/20 0552  NA 133* 135 138  K 3.7 3.6 3.4*  CL 100 100 102  CO2 26 26 27   GLUCOSE 127* 107* 132*  BUN 12 12 16   CREATININE 0.50* 0.58* 0.51*  CALCIUM 7.9* 8.0* 8.6*    ABG: Recent Labs  Lab 09/09/20 0845  PHART 7.479*  PCO2ART 36.3  PO2ART 75.6*  HCO3 27.0  O2SAT 96.2    Liver Function Tests: Recent Labs  Lab 09/09/20 0546  AST 26  ALT 18  ALKPHOS 72  BILITOT 0.6  PROT 5.7*  ALBUMIN 1.2*   No results for input(s): LIPASE, AMYLASE in the last 168 hours. No results for input(s): AMMONIA in the last 168 hours.  CBC: Recent Labs  Lab 09/09/20 0546 09/15/20 0552  WBC 7.1 7.4  HGB 9.3* 10.1*  HCT 29.9* 32.7*  MCV 75.1* 75.9*  PLT 283 253    Cardiac Enzymes: Recent Labs  Lab 09/11/20 0954  CKTOTAL 138    BNP (last 3  results) No results for input(s): BNP in the last 8760 hours.  ProBNP (last 3 results) No results for input(s): PROBNP in the last 8760 hours.  Radiological Exams: DG CHEST PORT 1 VIEW  Result Date: 09/15/2020 CLINICAL DATA:  Congestion, wheezing and shortness of breath EXAM: PORTABLE CHEST 1 VIEW COMPARISON:  09/08/2020 FINDINGS: Tracheostomy tube tip is above the carina. There is a left arm PICC line with tip at the cavoatrial junction. Stable cardiomediastinal contours. Decreased lung volumes with subsegmental atelectasis within the left lung base atelectasis. Improved appearance of previous small pleural effusions. IMPRESSION: 1. Decrease in bilateral pleural effusions. 2. Persistent subsegmental atelectasis in the left base. Electronically Signed   By: 09/17/2020 M.D.   On: 09/15/2020 07:52    Assessment/Plan Active Problems:   Acute on chronic respiratory failure with hypoxia (HCC)   Severe sepsis (HCC)   Quadriplegia, C1-C4 complete (HCC)   Tracheostomy status (HCC)   1. Acute on chronic respiratory failure hypoxia we will continue with T collar trials titrate oxygen continue pulmonary toilet. 2. Severe sepsis treated resolved 3. Quadriplegia no change continue present management 4. Tracheostomy will remain in place for suction access   I have personally seen and evaluated the patient, evaluated laboratory and imaging results, formulated the assessment and plan and placed orders. The Patient requires high  complexity decision making with multiple systems involvement.  Rounds were done with the Respiratory Therapy Director and Staff therapists and discussed with nursing staff also.  Allyne Gee, MD [facility] Pulmonary Critical Care Medicine Sleep Medicine

## 2020-09-16 DIAGNOSIS — G8251 Quadriplegia, C1-C4 complete: Secondary | ICD-10-CM | POA: Diagnosis not present

## 2020-09-16 DIAGNOSIS — Z930 Tracheostomy status: Secondary | ICD-10-CM | POA: Diagnosis not present

## 2020-09-16 DIAGNOSIS — A419 Sepsis, unspecified organism: Secondary | ICD-10-CM | POA: Diagnosis not present

## 2020-09-16 DIAGNOSIS — J9621 Acute and chronic respiratory failure with hypoxia: Secondary | ICD-10-CM | POA: Diagnosis not present

## 2020-09-16 LAB — POTASSIUM: Potassium: 3.7 mmol/L (ref 3.5–5.1)

## 2020-09-16 NOTE — Progress Notes (Signed)
Pulmonary Critical Care Medicine [facility] [place]   PULMONARY CRITICAL CARE SERVICE  PROGRESS NOTE  Date of Service: 09/16/2020  Anthony Melton  ZOX:[id number]  DOB: 03-13-1946   DOA: 09/08/2020  Referring Physician: Carron Curie, MD  HPI: Anthony Melton is a 75 y.o. male seen for follow up of Acute on Chronic Respiratory Failure.  Patient currently is on T collar has been on 20% FiO2 using the PMV  Medications: Reviewed on Rounds  Physical Exam:  Vitals: Temperature 98.6 pulse 84 respiratory 20 blood pressure is 106/64 saturations 100%  Ventilator Settings on T collar with PMV  . General: Comfortable at this time . Eyes: Grossly normal lids, irises & conjunctiva . ENT: grossly tongue is normal . Neck: no obvious mass . Cardiovascular: S1 S2 normal no gallop . Respiratory: Scattered coarse breath sounds noted bilaterally . Abdomen: soft . Skin: no rash seen on limited exam . Musculoskeletal: not rigid . Psychiatric:unable to assess . Neurologic: no seizure no involuntary movements         Lab Data:   Basic Metabolic Panel: Recent Labs  Lab 09/10/20 0408 09/15/20 0552 09/16/20 0850  NA 135 138  --   K 3.6 3.4* 3.7  CL 100 102  --   CO2 26 27  --   GLUCOSE 107* 132*  --   BUN 12 16  --   CREATININE 0.58* 0.51*  --   CALCIUM 8.0* 8.6*  --     ABG: No results for input(s): PHART, PCO2ART, PO2ART, HCO3, O2SAT in the last 168 hours.  Liver Function Tests: No results for input(s): AST, ALT, ALKPHOS, BILITOT, PROT, ALBUMIN in the last 168 hours. No results for input(s): LIPASE, AMYLASE in the last 168 hours. No results for input(s): AMMONIA in the last 168 hours.  CBC: Recent Labs  Lab 09/15/20 0552  WBC 7.4  HGB 10.1*  HCT 32.7*  MCV 75.9*  PLT 253    Cardiac Enzymes: Recent Labs  Lab 09/11/20 0954  CKTOTAL 138    BNP (last 3 results) No results for input(s): BNP in the last 8760 hours.  ProBNP (last 3 results) No  results for input(s): PROBNP in the last 8760 hours.  Radiological Exams: DG CHEST PORT 1 VIEW  Result Date: 09/15/2020 CLINICAL DATA:  Congestion, wheezing and shortness of breath EXAM: PORTABLE CHEST 1 VIEW COMPARISON:  09/08/2020 FINDINGS: Tracheostomy tube tip is above the carina. There is a left arm PICC line with tip at the cavoatrial junction. Stable cardiomediastinal contours. Decreased lung volumes with subsegmental atelectasis within the left lung base atelectasis. Improved appearance of previous small pleural effusions. IMPRESSION: 1. Decrease in bilateral pleural effusions. 2. Persistent subsegmental atelectasis in the left base. Electronically Signed   By: Signa Kell M.D.   On: 09/15/2020 07:52    Assessment/Plan Active Problems:   Acute on chronic respiratory failure with hypoxia (HCC)   Severe sepsis (HCC)   Quadriplegia, C1-C4 complete (HCC)   Tracheostomy status (HCC)   1. Acute on chronic respiratory failure hypoxia we will continue with PMV in T collar as tolerated 2. Severe sepsis treated resolved 3. Quadriplegia no change 4. Tracheostomy patient is at baseline right now   I have personally seen and evaluated the patient, evaluated laboratory and imaging results, formulated the assessment and plan and placed orders. The Patient requires high complexity decision making with multiple systems involvement.  Rounds were done with the Respiratory Therapy Director and Staff therapists and discussed with nursing staff  also.  Allyne Gee, MD [facility] Pulmonary Critical Care Medicine Sleep Medicine

## 2020-09-17 DIAGNOSIS — G8251 Quadriplegia, C1-C4 complete: Secondary | ICD-10-CM | POA: Diagnosis not present

## 2020-09-17 DIAGNOSIS — A419 Sepsis, unspecified organism: Secondary | ICD-10-CM | POA: Diagnosis not present

## 2020-09-17 DIAGNOSIS — Z930 Tracheostomy status: Secondary | ICD-10-CM | POA: Diagnosis not present

## 2020-09-17 DIAGNOSIS — J9621 Acute and chronic respiratory failure with hypoxia: Secondary | ICD-10-CM | POA: Diagnosis not present

## 2020-09-17 LAB — CULTURE, RESPIRATORY W GRAM STAIN: Culture: NORMAL

## 2020-09-17 NOTE — Progress Notes (Signed)
Pulmonary Critical Care Medicine [facility] [place]   PULMONARY CRITICAL CARE SERVICE  PROGRESS NOTE  Date of Service: 09/17/2020  Anthony Melton  [id number]  DOB: [birth year]   DOA: 09/08/2020  Referring Physician: Carron Curie, MD  HPI: Anthony Melton is a 75 y.o. male seen for follow up of Acute on Chronic Respiratory Failure.  Patient currently is on T collar has been on 28% FiO2 with good saturations noted  Medications: Reviewed on Rounds  Physical Exam:  Vitals: Temperature is 100.1 pulse 88 respiratory rate is 24 blood pressure is 111/65 saturations 99%  Ventilator Settings off the ventilator on T collar FiO2 28%  . General: Comfortable at this time . Eyes: Grossly normal lids, irises & conjunctiva . ENT: grossly tongue is normal . Neck: no obvious mass . Cardiovascular: S1 S2 normal no gallop . Respiratory: Scattered rhonchi expansion is equal . Abdomen: soft . Skin: no rash seen on limited exam . Musculoskeletal: not rigid . Psychiatric:unable to assess . Neurologic: no seizure no involuntary movements         Lab Data:   Basic Metabolic Panel: Recent Labs  Lab 09/15/20 0552 09/16/20 0850  NA 138  --   K 3.4* 3.7  CL 102  --   CO2 27  --   GLUCOSE 132*  --   BUN 16  --   CREATININE 0.51*  --   CALCIUM 8.6*  --     ABG: No results for input(s): PHART, PCO2ART, PO2ART, HCO3, O2SAT in the last 168 hours.  Liver Function Tests: No results for input(s): AST, ALT, ALKPHOS, BILITOT, PROT, ALBUMIN in the last 168 hours. No results for input(s): LIPASE, AMYLASE in the last 168 hours. No results for input(s): AMMONIA in the last 168 hours.  CBC: Recent Labs  Lab 09/15/20 0552  WBC 7.4  HGB 10.1*  HCT 32.7*  MCV 75.9*  PLT 253    Cardiac Enzymes: Recent Labs  Lab 09/11/20 0954  CKTOTAL 138    BNP (last 3 results) No results for input(s): BNP in the last 8760 hours.  ProBNP (last 3 results) No results for input(s):  PROBNP in the last 8760 hours.  Radiological Exams: No results found.  Assessment/Plan Active Problems:   Acute on chronic respiratory failure with hypoxia (HCC)   Severe sepsis (HCC)   Quadriplegia, C1-C4 complete (HCC)   Tracheostomy status (HCC)   1. Acute on chronic respiratory failure hypoxia we will continue with the T-piece titrate oxygen as tolerated 2. Severe sepsis resolved 3. Quadriplegia no change 4. Tracheostomy will likely remain in place long-term   I have personally seen and evaluated the patient, evaluated laboratory and imaging results, formulated the assessment and plan and placed orders. The Patient requires high complexity decision making with multiple systems involvement.  Rounds were done with the Respiratory Therapy Director and Staff therapists and discussed with nursing staff also.  Yevonne Pax, MD [facility] Pulmonary Critical Care Medicine Sleep Medicine

## 2020-09-18 DIAGNOSIS — G8251 Quadriplegia, C1-C4 complete: Secondary | ICD-10-CM | POA: Diagnosis not present

## 2020-09-18 DIAGNOSIS — A419 Sepsis, unspecified organism: Secondary | ICD-10-CM | POA: Diagnosis not present

## 2020-09-18 DIAGNOSIS — J9621 Acute and chronic respiratory failure with hypoxia: Secondary | ICD-10-CM | POA: Diagnosis not present

## 2020-09-18 DIAGNOSIS — Z930 Tracheostomy status: Secondary | ICD-10-CM | POA: Diagnosis not present

## 2020-09-18 NOTE — Progress Notes (Signed)
Pulmonary Critical Care Medicine [facility] [place]   PULMONARY CRITICAL CARE SERVICE  PROGRESS NOTE  Date of Service: 09/18/2020  Anthony Melton  [id number]  DOB: Jul 13, 1946   DOA: 09/08/2020  Referring Physician: Carron Curie, MD  HPI: Anthony Melton is a 75 y.o. male seen for follow up of Acute on Chronic Respiratory Failure.  Patient currently is on T collar has been on 28% FiO2 with saturations of 96%  Medications: Reviewed on Rounds  Physical Exam:  Vitals: Temperature is 98.9 pulse 93 respiratory rate 17 blood pressure is 145/83 saturations 96%  Ventilator Settings on T collar with an FiO2 of 28%  . General: Comfortable at this time . Eyes: Grossly normal lids, irises & conjunctiva . ENT: grossly tongue is normal . Neck: no obvious mass . Cardiovascular: S1 S2 normal no gallop . Respiratory: Scattered rhonchi coarse breath sound . Abdomen: soft . Skin: no rash seen on limited exam . Musculoskeletal: not rigid . Psychiatric:unable to assess . Neurologic: no seizure no involuntary movements         Lab Data:   Basic Metabolic Panel: Recent Labs  Lab 09/15/20 0552 09/16/20 0850  NA 138  --   K 3.4* 3.7  CL 102  --   CO2 27  --   GLUCOSE 132*  --   BUN 16  --   CREATININE 0.51*  --   CALCIUM 8.6*  --     ABG: No results for input(s): PHART, PCO2ART, PO2ART, HCO3, O2SAT in the last 168 hours.  Liver Function Tests: No results for input(s): AST, ALT, ALKPHOS, BILITOT, PROT, ALBUMIN in the last 168 hours. No results for input(s): LIPASE, AMYLASE in the last 168 hours. No results for input(s): AMMONIA in the last 168 hours.  CBC: Recent Labs  Lab 09/15/20 0552  WBC 7.4  HGB 10.1*  HCT 32.7*  MCV 75.9*  PLT 253    Cardiac Enzymes: Recent Labs  Lab 09/11/20 0954  CKTOTAL 138    BNP (last 3 results) No results for input(s): BNP in the last 8760 hours.  ProBNP (last 3 results) No results for input(s): PROBNP in the  last 8760 hours.  Radiological Exams: No results found.  Assessment/Plan Active Problems:   Acute on chronic respiratory failure with hypoxia (HCC)   Severe sepsis (HCC)   Quadriplegia, C1-C4 complete (HCC)   Tracheostomy status (HCC)   1. Acute on chronic respiratory failure hypoxia we will continue with T collar trials patient has been requiring 28% FiO2 2. Severe sepsis treated resolved 3. Quadriplegia no change we will continue to follow 4. Tracheostomy remains in place right now   I have personally seen and evaluated the patient, evaluated laboratory and imaging results, formulated the assessment and plan and placed orders. The Patient requires high complexity decision making with multiple systems involvement.  Rounds were done with the Respiratory Therapy Director and Staff therapists and discussed with nursing staff also.  Yevonne Pax, MD [facility] Pulmonary Critical Care Medicine Sleep Medicine

## 2020-09-19 DIAGNOSIS — A419 Sepsis, unspecified organism: Secondary | ICD-10-CM | POA: Diagnosis not present

## 2020-09-19 DIAGNOSIS — G8251 Quadriplegia, C1-C4 complete: Secondary | ICD-10-CM | POA: Diagnosis not present

## 2020-09-19 DIAGNOSIS — J9621 Acute and chronic respiratory failure with hypoxia: Secondary | ICD-10-CM | POA: Diagnosis not present

## 2020-09-19 DIAGNOSIS — Z930 Tracheostomy status: Secondary | ICD-10-CM | POA: Diagnosis not present

## 2020-09-19 NOTE — Progress Notes (Signed)
Pulmonary Critical Care Medicine [facility] [place]   PULMONARY CRITICAL CARE SERVICE  PROGRESS NOTE  Date of Service: 09/19/2020  Anthony Melton  [id number]  DOB: 1946-04-15   DOA: 09/08/2020  Referring Physician: Carron Curie, MD  HPI: Anthony Melton is a 75 y.o. male seen for follow up of Acute on Chronic Respiratory Failure.  Patient currently is on T collar on 28% FiO2 has copious secretions noted  Medications: Reviewed on Rounds  Physical Exam:  Vitals: Temperature is 97.7 pulse 57 respiratory 22 blood pressure is 115/72 saturations 100%  Ventilator Settings currently off the ventilator on T collar with an FiO2 of 28%  . General: Comfortable at this time . Eyes: Grossly normal lids, irises & conjunctiva . ENT: grossly tongue is normal . Neck: no obvious mass . Cardiovascular: S1 S2 normal no gallop . Respiratory: Coarse breath sounds scattered rhonchi . Abdomen: soft . Skin: no rash seen on limited exam . Musculoskeletal: not rigid . Psychiatric:unable to assess . Neurologic: no seizure no involuntary movements         Lab Data:   Basic Metabolic Panel: Recent Labs  Lab 09/15/20 0552 09/16/20 0850  NA 138  --   K 3.4* 3.7  CL 102  --   CO2 27  --   GLUCOSE 132*  --   BUN 16  --   CREATININE 0.51*  --   CALCIUM 8.6*  --     ABG: No results for input(s): PHART, PCO2ART, PO2ART, HCO3, O2SAT in the last 168 hours.  Liver Function Tests: No results for input(s): AST, ALT, ALKPHOS, BILITOT, PROT, ALBUMIN in the last 168 hours. No results for input(s): LIPASE, AMYLASE in the last 168 hours. No results for input(s): AMMONIA in the last 168 hours.  CBC: Recent Labs  Lab 09/15/20 0552  WBC 7.4  HGB 10.1*  HCT 32.7*  MCV 75.9*  PLT 253    Cardiac Enzymes: No results for input(s): CKTOTAL, CKMB, CKMBINDEX, TROPONINI in the last 168 hours.  BNP (last 3 results) No results for input(s): BNP in the last 8760 hours.  ProBNP  (last 3 results) No results for input(s): PROBNP in the last 8760 hours.  Radiological Exams: No results found.  Assessment/Plan Active Problems:   Acute on chronic respiratory failure with hypoxia (HCC)   Severe sepsis (HCC)   Quadriplegia, C1-C4 complete (HCC)   Tracheostomy status (HCC)   1. Acute on chronic respiratory failure with hypoxia we will continue with T collar titrate oxygen continue pulmonary toilet. 2. Severe sepsis resolved 3. Quadriplegia no change supportive care 4. Tracheostomy remains in place   I have personally seen and evaluated the patient, evaluated laboratory and imaging results, formulated the assessment and plan and placed orders. The Patient requires high complexity decision making with multiple systems involvement.  Rounds were done with the Respiratory Therapy Director and Staff therapists and discussed with nursing staff also.  Yevonne Pax, MD [facility] Pulmonary Critical Care Medicine Sleep Medicine

## 2020-09-20 DIAGNOSIS — G8251 Quadriplegia, C1-C4 complete: Secondary | ICD-10-CM | POA: Diagnosis not present

## 2020-09-20 DIAGNOSIS — Z930 Tracheostomy status: Secondary | ICD-10-CM | POA: Diagnosis not present

## 2020-09-20 DIAGNOSIS — A419 Sepsis, unspecified organism: Secondary | ICD-10-CM | POA: Diagnosis not present

## 2020-09-20 DIAGNOSIS — J9621 Acute and chronic respiratory failure with hypoxia: Secondary | ICD-10-CM | POA: Diagnosis not present

## 2020-09-20 LAB — CBC
HCT: 31.2 % — ABNORMAL LOW (ref 39.0–52.0)
Hemoglobin: 9.6 g/dL — ABNORMAL LOW (ref 13.0–17.0)
MCH: 23.5 pg — ABNORMAL LOW (ref 26.0–34.0)
MCHC: 30.8 g/dL (ref 30.0–36.0)
MCV: 76.5 fL — ABNORMAL LOW (ref 80.0–100.0)
Platelets: 216 10*3/uL (ref 150–400)
RBC: 4.08 MIL/uL — ABNORMAL LOW (ref 4.22–5.81)
RDW: 20.1 % — ABNORMAL HIGH (ref 11.5–15.5)
WBC: 8.6 10*3/uL (ref 4.0–10.5)
nRBC: 0 % (ref 0.0–0.2)

## 2020-09-20 LAB — BASIC METABOLIC PANEL
Anion gap: 10 (ref 5–15)
BUN: 26 mg/dL — ABNORMAL HIGH (ref 8–23)
CO2: 30 mmol/L (ref 22–32)
Calcium: 8.9 mg/dL (ref 8.9–10.3)
Chloride: 107 mmol/L (ref 98–111)
Creatinine, Ser: 0.62 mg/dL (ref 0.61–1.24)
GFR, Estimated: >60 mL/min (ref >60)
Glucose, Bld: 119 mg/dL — ABNORMAL HIGH (ref 70–99)
Potassium: 3.3 mmol/L — ABNORMAL LOW (ref 3.5–5.1)
Sodium: 147 mmol/L — ABNORMAL HIGH (ref 135–145)

## 2020-09-20 NOTE — Progress Notes (Signed)
Pulmonary Critical Care Medicine [facility] [place]   PULMONARY CRITICAL CARE SERVICE  PROGRESS NOTE  Date of Service: 09/20/2020  Anthony Melton  [id number]  DOB: [birth year]   DOA: 09/08/2020  Referring Physician: Carron Curie, MD  HPI: Anthony Melton is a 75 y.o. male seen for follow up of Acute on Chronic Respiratory Failure.  Patient at this time is on T collar currently on 28% FiO2  Medications: Reviewed on Rounds  Physical Exam:  Vitals: Temperature 97.1 pulse 62 respiratory 22 blood pressure is 109/68 saturations 100%  Ventilator Settings on T collar with an FiO2 28%  . General: Comfortable at this time . Eyes: Grossly normal lids, irises & conjunctiva . ENT: grossly tongue is normal . Neck: no obvious mass . Cardiovascular: S1 S2 normal no gallop . Respiratory: Coarse rhonchi expansion is equal at this time . Abdomen: soft . Skin: no rash seen on limited exam . Musculoskeletal: not rigid . Psychiatric:unable to assess . Neurologic: no seizure no involuntary movements         Lab Data:   Basic Metabolic Panel: Recent Labs  Lab 09/15/20 0552 09/16/20 0850 09/20/20 0413  NA 138  --  147*  K 3.4* 3.7 3.3*  CL 102  --  107  CO2 27  --  30  GLUCOSE 132*  --  119*  BUN 16  --  26*  CREATININE 0.51*  --  0.62  CALCIUM 8.6*  --  8.9    ABG: No results for input(s): PHART, PCO2ART, PO2ART, HCO3, O2SAT in the last 168 hours.  Liver Function Tests: No results for input(s): AST, ALT, ALKPHOS, BILITOT, PROT, ALBUMIN in the last 168 hours. No results for input(s): LIPASE, AMYLASE in the last 168 hours. No results for input(s): AMMONIA in the last 168 hours.  CBC: Recent Labs  Lab 09/15/20 0552 09/20/20 0413  WBC 7.4 8.6  HGB 10.1* 9.6*  HCT 32.7* 31.2*  MCV 75.9* 76.5*  PLT 253 216    Cardiac Enzymes: No results for input(s): CKTOTAL, CKMB, CKMBINDEX, TROPONINI in the last 168 hours.  BNP (last 3 results) No results for  input(s): BNP in the last 8760 hours.  ProBNP (last 3 results) No results for input(s): PROBNP in the last 8760 hours.  Radiological Exams: No results found.  Assessment/Plan Active Problems:   Acute on chronic respiratory failure with hypoxia (HCC)   Severe sepsis (HCC)   Quadriplegia, C1-C4 complete (HCC)   Tracheostomy status (HCC)   1. Acute on chronic respiratory failure hypoxia we will continue with T-piece titrate oxygen continue pulmonary toilet. 2. Severe sepsis treated resolved we will continue to monitor. 3. Quadriplegia no change we will continue to monitor. 4. Tracheostomy right now will remain in place   I have personally seen and evaluated the patient, evaluated laboratory and imaging results, formulated the assessment and plan and placed orders. The Patient requires high complexity decision making with multiple systems involvement.  Rounds were done with the Respiratory Therapy Director and Staff therapists and discussed with nursing staff also.  Yevonne Pax, MD [facility] Pulmonary Critical Care Medicine Sleep Medicine

## 2020-09-21 DIAGNOSIS — A419 Sepsis, unspecified organism: Secondary | ICD-10-CM | POA: Diagnosis not present

## 2020-09-21 DIAGNOSIS — Z930 Tracheostomy status: Secondary | ICD-10-CM | POA: Diagnosis not present

## 2020-09-21 DIAGNOSIS — J9621 Acute and chronic respiratory failure with hypoxia: Secondary | ICD-10-CM | POA: Diagnosis not present

## 2020-09-21 DIAGNOSIS — G8251 Quadriplegia, C1-C4 complete: Secondary | ICD-10-CM | POA: Diagnosis not present

## 2020-09-21 LAB — POTASSIUM: Potassium: 3.3 mmol/L — ABNORMAL LOW (ref 3.5–5.1)

## 2020-09-21 NOTE — Progress Notes (Signed)
Pulmonary Critical Care Medicine [facility] [place]   PULMONARY CRITICAL CARE SERVICE  PROGRESS NOTE  Date of Service: 09/21/2020  Anthony Melton  UYQ:[id number]  DOB: October 21, 1945   DOA: 09/08/2020  Referring Physician: Carron Curie, MD  HPI: Anthony Melton is a 75 y.o. male seen for follow up of Acute on Chronic Respiratory Failure.  Patient currently is on T collar has been on 20% FiO2 using the PMV  Medications: Reviewed on Rounds  Physical Exam:  Vitals: Temperature is 97.9 pulse 66 respiratory 28 blood pressure is 97/64 saturations 100%  Ventilator Settings patient currently is on T collar using PMV  . General: Comfortable at this time . Eyes: Grossly normal lids, irises & conjunctiva . ENT: grossly tongue is normal . Neck: no obvious mass . Cardiovascular: S1 S2 normal no gallop . Respiratory: Coarse breath sounds with few scattered rhonchi . Abdomen: soft . Skin: no rash seen on limited exam . Musculoskeletal: not rigid . Psychiatric:unable to assess . Neurologic: no seizure no involuntary movements         Lab Data:   Basic Metabolic Panel: Recent Labs  Lab 09/15/20 0552 09/16/20 0850 09/20/20 0413 09/21/20 0407  NA 138  --  147*  --   K 3.4* 3.7 3.3* 3.3*  CL 102  --  107  --   CO2 27  --  30  --   GLUCOSE 132*  --  119*  --   BUN 16  --  26*  --   CREATININE 0.51*  --  0.62  --   CALCIUM 8.6*  --  8.9  --     ABG: No results for input(s): PHART, PCO2ART, PO2ART, HCO3, O2SAT in the last 168 hours.  Liver Function Tests: No results for input(s): AST, ALT, ALKPHOS, BILITOT, PROT, ALBUMIN in the last 168 hours. No results for input(s): LIPASE, AMYLASE in the last 168 hours. No results for input(s): AMMONIA in the last 168 hours.  CBC: Recent Labs  Lab 09/15/20 0552 09/20/20 0413  WBC 7.4 8.6  HGB 10.1* 9.6*  HCT 32.7* 31.2*  MCV 75.9* 76.5*  PLT 253 216    Cardiac Enzymes: No results for input(s): CKTOTAL, CKMB,  CKMBINDEX, TROPONINI in the last 168 hours.  BNP (last 3 results) No results for input(s): BNP in the last 8760 hours.  ProBNP (last 3 results) No results for input(s): PROBNP in the last 8760 hours.  Radiological Exams: No results found.  Assessment/Plan Active Problems:   Acute on chronic respiratory failure with hypoxia (HCC)   Severe sepsis (HCC)   Quadriplegia, C1-C4 complete (HCC)   Tracheostomy status (HCC)   1. Acute on chronic respiratory failure with hypoxia we will continue with T collar and PMV 2. Severe sepsis treated and resolved 3. Quadriplegia no change 4. Tracheostomy remains in place   I have personally seen and evaluated the patient, evaluated laboratory and imaging results, formulated the assessment and plan and placed orders. The Patient requires high complexity decision making with multiple systems involvement.  Rounds were done with the Respiratory Therapy Director and Staff therapists and discussed with nursing staff also.  Yevonne Pax, MD [facility] Pulmonary Critical Care Medicine Sleep Medicine

## 2020-09-22 DIAGNOSIS — G8251 Quadriplegia, C1-C4 complete: Secondary | ICD-10-CM | POA: Diagnosis not present

## 2020-09-22 DIAGNOSIS — A419 Sepsis, unspecified organism: Secondary | ICD-10-CM | POA: Diagnosis not present

## 2020-09-22 DIAGNOSIS — Z930 Tracheostomy status: Secondary | ICD-10-CM | POA: Diagnosis not present

## 2020-09-22 DIAGNOSIS — J9621 Acute and chronic respiratory failure with hypoxia: Secondary | ICD-10-CM | POA: Diagnosis not present

## 2020-09-22 NOTE — Progress Notes (Signed)
Pulmonary Critical Care Medicine [facility] [place]   PULMONARY CRITICAL CARE SERVICE  PROGRESS NOTE  Date of Service: 09/22/2020  Anthony Melton  [id number]  DOB: [birth year]   DOA: 09/08/2020  Referring Physician: Carron Curie, MD  HPI: Anthony Melton is a 75 y.o. male seen for follow up of Acute on Chronic Respiratory Failure.  On T collar with an FiO2 28% right now is comfortable  Medications: Reviewed on Rounds  Physical Exam:  Vitals: Temperature is 97.8 pulse 63 respiratory 20 blood pressure 149/88 saturations 99%  Ventilator Settings on T collar FiO2 28%  . General: Comfortable at this time . Eyes: Grossly normal lids, irises & conjunctiva . ENT: grossly tongue is normal . Neck: no obvious mass . Cardiovascular: S1 S2 normal no gallop . Respiratory: No rhonchi very coarse breath sound . Abdomen: soft . Skin: no rash seen on limited exam . Musculoskeletal: not rigid . Psychiatric:unable to assess . Neurologic: no seizure no involuntary movements         Lab Data:   Basic Metabolic Panel: Recent Labs  Lab 09/16/20 0850 09/20/20 0413 09/21/20 0407  NA  --  147*  --   K 3.7 3.3* 3.3*  CL  --  107  --   CO2  --  30  --   GLUCOSE  --  119*  --   BUN  --  26*  --   CREATININE  --  0.62  --   CALCIUM  --  8.9  --     ABG: No results for input(s): PHART, PCO2ART, PO2ART, HCO3, O2SAT in the last 168 hours.  Liver Function Tests: No results for input(s): AST, ALT, ALKPHOS, BILITOT, PROT, ALBUMIN in the last 168 hours. No results for input(s): LIPASE, AMYLASE in the last 168 hours. No results for input(s): AMMONIA in the last 168 hours.  CBC: Recent Labs  Lab 09/20/20 0413  WBC 8.6  HGB 9.6*  HCT 31.2*  MCV 76.5*  PLT 216    Cardiac Enzymes: No results for input(s): CKTOTAL, CKMB, CKMBINDEX, TROPONINI in the last 168 hours.  BNP (last 3 results) No results for input(s): BNP in the last 8760 hours.  ProBNP (last 3  results) No results for input(s): PROBNP in the last 8760 hours.  Radiological Exams: No results found.  Assessment/Plan Active Problems:   Acute on chronic respiratory failure with hypoxia (HCC)   Severe sepsis (HCC)   Quadriplegia, C1-C4 complete (HCC)   Tracheostomy status (HCC)   1. Acute on chronic respiratory failure with hypoxia patient right now is on T collar with an FiO2 of 28% doing fine secretions are fair to moderate. 2. Severe sepsis treated resolved we will continue to follow. 3. Conclusion no change supportive care 4. Tracheostomy will remain in place   I have personally seen and evaluated the patient, evaluated laboratory and imaging results, formulated the assessment and plan and placed orders. The Patient requires high complexity decision making with multiple systems involvement.  Rounds were done with the Respiratory Therapy Director and Staff therapists and discussed with nursing staff also.  Yevonne Pax, MD [facility] Pulmonary Critical Care Medicine Sleep Medicine

## 2020-09-23 DIAGNOSIS — J9621 Acute and chronic respiratory failure with hypoxia: Secondary | ICD-10-CM | POA: Diagnosis not present

## 2020-09-23 DIAGNOSIS — A419 Sepsis, unspecified organism: Secondary | ICD-10-CM | POA: Diagnosis not present

## 2020-09-23 DIAGNOSIS — Z930 Tracheostomy status: Secondary | ICD-10-CM | POA: Diagnosis not present

## 2020-09-23 DIAGNOSIS — G8251 Quadriplegia, C1-C4 complete: Secondary | ICD-10-CM | POA: Diagnosis not present

## 2020-09-23 NOTE — Progress Notes (Signed)
Pulmonary Critical Care Medicine [facility] [place]   PULMONARY CRITICAL CARE SERVICE  PROGRESS NOTE  Date of Service: 09/23/2020  Anthony Melton  PYP:[id number]  DOB: Jun 01, 1946   DOA: 09/08/2020  Referring Physician: Carron Curie, MD  HPI: Anthony Melton is a 75 y.o. male seen for follow up of Acute on Chronic Respiratory Failure. Patient is afebrile right now resting comfortably has been on T collar using PMV currently is on 28% FiO2  Medications: Reviewed on Rounds  Physical Exam:  Vitals: Temperature 97.2 pulse 63 respiratory 19 blood pressure is 159/89 saturations 100%  Ventilator Settings on T collar FiO2 28%  . General: Comfortable at this time . Eyes: Grossly normal lids, irises & conjunctiva . ENT: grossly tongue is normal . Neck: no obvious mass . Cardiovascular: S1 S2 normal no gallop . Respiratory: No rhonchi no rales are noted at this time . Abdomen: soft . Skin: no rash seen on limited exam . Musculoskeletal: not rigid . Psychiatric:unable to assess . Neurologic: no seizure no involuntary movements         Lab Data:   Basic Metabolic Panel: Recent Labs  Lab 09/20/20 0413 09/21/20 0407  NA 147*  --   K 3.3* 3.3*  CL 107  --   CO2 30  --   GLUCOSE 119*  --   BUN 26*  --   CREATININE 0.62  --   CALCIUM 8.9  --     ABG: No results for input(s): PHART, PCO2ART, PO2ART, HCO3, O2SAT in the last 168 hours.  Liver Function Tests: No results for input(s): AST, ALT, ALKPHOS, BILITOT, PROT, ALBUMIN in the last 168 hours. No results for input(s): LIPASE, AMYLASE in the last 168 hours. No results for input(s): AMMONIA in the last 168 hours.  CBC: Recent Labs  Lab 09/20/20 0413  WBC 8.6  HGB 9.6*  HCT 31.2*  MCV 76.5*  PLT 216    Cardiac Enzymes: No results for input(s): CKTOTAL, CKMB, CKMBINDEX, TROPONINI in the last 168 hours.  BNP (last 3 results) No results for input(s): BNP in the last 8760 hours.  ProBNP (last 3  results) No results for input(s): PROBNP in the last 8760 hours.  Radiological Exams: No results found.  Assessment/Plan Active Problems:   Acute on chronic respiratory failure with hypoxia (HCC)   Severe sepsis (HCC)   Quadriplegia, C1-C4 complete (HCC)   Tracheostomy status (HCC)   1. Acute on chronic respiratory failure hypoxia continue with T collar trials titrate oxygen as tolerated continue pulmonary toilet. 2. Severe sepsis treated resolved 3. Quadriplegia no change 4. Tracheostomy remains in place   I have personally seen and evaluated the patient, evaluated laboratory and imaging results, formulated the assessment and plan and placed orders. The Patient requires high complexity decision making with multiple systems involvement.  Rounds were done with the Respiratory Therapy Director and Staff therapists and discussed with nursing staff also.  Yevonne Pax, MD [facility] Pulmonary Critical Care Medicine Sleep Medicine

## 2020-09-24 DIAGNOSIS — G8251 Quadriplegia, C1-C4 complete: Secondary | ICD-10-CM | POA: Diagnosis not present

## 2020-09-24 DIAGNOSIS — Z930 Tracheostomy status: Secondary | ICD-10-CM | POA: Diagnosis not present

## 2020-09-24 DIAGNOSIS — A419 Sepsis, unspecified organism: Secondary | ICD-10-CM | POA: Diagnosis not present

## 2020-09-24 DIAGNOSIS — J9621 Acute and chronic respiratory failure with hypoxia: Secondary | ICD-10-CM | POA: Diagnosis not present

## 2020-09-24 NOTE — Progress Notes (Signed)
Pulmonary Critical Care Medicine [facility] [place]   PULMONARY CRITICAL CARE SERVICE  PROGRESS NOTE  Date of Service: 09/24/2020  Anthony Melton  [id number]  DOB: 04/04/1946   DOA: 09/08/2020  Referring Physician: Carron Curie, MD  HPI: Anthony Melton is a 75 y.o. male seen for follow up of Acute on Chronic Respiratory Failure.  Patient currently is on T collar has been on 28% FiO2  Medications: Reviewed on Rounds  Physical Exam:  Vitals: Temperature is 97.1 pulse 100 respiratory 21 blood pressure is 124/71 saturations 100%  Ventilator Settings off the ventilator on T collar with PMV  . General: Comfortable at this time . Eyes: Grossly normal lids, irises & conjunctiva . ENT: grossly tongue is normal . Neck: no obvious mass . Cardiovascular: S1 S2 normal no gallop . Respiratory: No rhonchi very coarse breath sound . Abdomen: soft . Skin: no rash seen on limited exam . Musculoskeletal: not rigid . Psychiatric:unable to assess . Neurologic: no seizure no involuntary movements         Lab Data:   Basic Metabolic Panel: Recent Labs  Lab 09/20/20 0413 09/21/20 0407  NA 147*  --   K 3.3* 3.3*  CL 107  --   CO2 30  --   GLUCOSE 119*  --   BUN 26*  --   CREATININE 0.62  --   CALCIUM 8.9  --     ABG: No results for input(s): PHART, PCO2ART, PO2ART, HCO3, O2SAT in the last 168 hours.  Liver Function Tests: No results for input(s): AST, ALT, ALKPHOS, BILITOT, PROT, ALBUMIN in the last 168 hours. No results for input(s): LIPASE, AMYLASE in the last 168 hours. No results for input(s): AMMONIA in the last 168 hours.  CBC: Recent Labs  Lab 09/20/20 0413  WBC 8.6  HGB 9.6*  HCT 31.2*  MCV 76.5*  PLT 216    Cardiac Enzymes: No results for input(s): CKTOTAL, CKMB, CKMBINDEX, TROPONINI in the last 168 hours.  BNP (last 3 results) No results for input(s): BNP in the last 8760 hours.  ProBNP (last 3 results) No results for input(s):  PROBNP in the last 8760 hours.  Radiological Exams: No results found.  Assessment/Plan Active Problems:   Acute on chronic respiratory failure with hypoxia (HCC)   Severe sepsis (HCC)   Quadriplegia, C1-C4 complete (HCC)   Tracheostomy status (HCC)   1. Acute on chronic respiratory failure with hypoxia patient continues to do okay with T collar and PMV.  Secretions remain an issue 2. Severe sepsis treated resolved 3. Quadriplegia no change we will continue with supportive care 4. Tracheostomy remains in place   I have personally seen and evaluated the patient, evaluated laboratory and imaging results, formulated the assessment and plan and placed orders. The Patient requires high complexity decision making with multiple systems involvement.  Rounds were done with the Respiratory Therapy Director and Staff therapists and discussed with nursing staff also.  Yevonne Pax, MD [facility] Pulmonary Critical Care Medicine Sleep Medicine

## 2020-09-25 DIAGNOSIS — A419 Sepsis, unspecified organism: Secondary | ICD-10-CM | POA: Diagnosis not present

## 2020-09-25 DIAGNOSIS — J9621 Acute and chronic respiratory failure with hypoxia: Secondary | ICD-10-CM | POA: Diagnosis not present

## 2020-09-25 DIAGNOSIS — G8251 Quadriplegia, C1-C4 complete: Secondary | ICD-10-CM | POA: Diagnosis not present

## 2020-09-25 DIAGNOSIS — Z930 Tracheostomy status: Secondary | ICD-10-CM | POA: Diagnosis not present

## 2020-09-25 LAB — BASIC METABOLIC PANEL
Anion gap: 11 (ref 5–15)
BUN: 28 mg/dL — ABNORMAL HIGH (ref 8–23)
CO2: 30 mmol/L (ref 22–32)
Calcium: 9 mg/dL (ref 8.9–10.3)
Chloride: 110 mmol/L (ref 98–111)
Creatinine, Ser: 0.63 mg/dL (ref 0.61–1.24)
GFR, Estimated: >60 mL/min (ref >60)
Glucose, Bld: 114 mg/dL — ABNORMAL HIGH (ref 70–99)
Potassium: 3.1 mmol/L — ABNORMAL LOW (ref 3.5–5.1)
Sodium: 151 mmol/L — ABNORMAL HIGH (ref 135–145)

## 2020-09-25 LAB — CBC
HCT: 32.7 % — ABNORMAL LOW (ref 39.0–52.0)
Hemoglobin: 10.2 g/dL — ABNORMAL LOW (ref 13.0–17.0)
MCH: 23.8 pg — ABNORMAL LOW (ref 26.0–34.0)
MCHC: 31.2 g/dL (ref 30.0–36.0)
MCV: 76.2 fL — ABNORMAL LOW (ref 80.0–100.0)
Platelets: 267 10*3/uL (ref 150–400)
RBC: 4.29 MIL/uL (ref 4.22–5.81)
RDW: 20.7 % — ABNORMAL HIGH (ref 11.5–15.5)
WBC: 7.4 10*3/uL (ref 4.0–10.5)
nRBC: 0.3 % — ABNORMAL HIGH (ref 0.0–0.2)

## 2020-09-25 NOTE — Progress Notes (Signed)
Pulmonary Critical Care Medicine [facility] [place]   PULMONARY CRITICAL CARE SERVICE  PROGRESS NOTE  Date of Service: 09/25/2020  Anthony Melton  [id number]  DOB: 30-Mar-1946   DOA: 09/08/2020  Referring Physician: Carron Curie, MD  Anthony Melton is a 75 y.o. male seen for follow up of Acute on Chronic Respiratory Failure.  Patient remains on Anthony T collar copious secretions are reported patient's been on 28% FiO2  Medications: Reviewed on Rounds  Physical Exam:  Vitals: Temperature is 98.1 pulse 72 respiratory rate 20 blood pressure 109/69 saturations 99%  Ventilator Settings off Anthony ventilator on T collar  . General: Comfortable at this time . Eyes: Grossly normal lids, irises & conjunctiva . ENT: grossly tongue is normal . Neck: no obvious mass . Cardiovascular: S1 S2 normal no gallop . Respiratory: No rhonchi very coarse breath sound . Abdomen: soft . Skin: no rash seen on limited exam . Musculoskeletal: not rigid . Psychiatric:unable to assess . Neurologic: no seizure no involuntary movements         Lab Data:   Basic Metabolic Panel: Recent Labs  Lab 09/20/20 0413 09/21/20 0407 09/25/20 0430  NA 147*  --  151*  K 3.3* 3.3* 3.1*  CL 107  --  110  CO2 30  --  30  GLUCOSE 119*  --  114*  BUN 26*  --  28*  CREATININE 0.62  --  0.63  CALCIUM 8.9  --  9.0    ABG: No results for input(s): PHART, PCO2ART, PO2ART, HCO3, O2SAT in Anthony last 168 hours.  Liver Function Tests: No results for input(s): AST, ALT, ALKPHOS, BILITOT, PROT, ALBUMIN in Anthony last 168 hours. No results for input(s): LIPASE, AMYLASE in Anthony last 168 hours. No results for input(s): AMMONIA in Anthony last 168 hours.  CBC: Recent Labs  Lab 09/20/20 0413 09/25/20 0430  WBC 8.6 7.4  HGB 9.6* 10.2*  HCT 31.2* 32.7*  MCV 76.5* 76.2*  PLT 216 267    Cardiac Enzymes: No results for input(s): CKTOTAL, CKMB, CKMBINDEX, TROPONINI in Anthony last 168 hours.  BNP (last 3  results) No results for input(s): BNP in Anthony last 8760 hours.  ProBNP (last 3 results) No results for input(s): PROBNP in Anthony last 8760 hours.  Radiological Exams: No results found.  Assessment/Plan Active Problems:   Acute on chronic respiratory failure with hypoxia (HCC)   Severe sepsis (HCC)   Quadriplegia, C1-C4 complete (HCC)   Tracheostomy status (HCC)   1. Acute on chronic respiratory failure with hypoxia we will continue with T collar trials titrate oxygen continue pulmonary toilet. 2. Severe sepsis treated in resolution 3. Quadriplegia no change 4. Tracheostomy remains in place   I have personally seen and evaluated Anthony patient, evaluated laboratory and imaging results, formulated Anthony assessment and plan and placed orders. Anthony Melton.  Rounds were done with Anthony Respiratory Therapy Director and Staff therapists and discussed with nursing staff also.  Yevonne Pax, MD [facility] Pulmonary Critical Care Medicine Sleep Medicine

## 2020-09-26 DIAGNOSIS — Z930 Tracheostomy status: Secondary | ICD-10-CM | POA: Diagnosis not present

## 2020-09-26 DIAGNOSIS — A419 Sepsis, unspecified organism: Secondary | ICD-10-CM | POA: Diagnosis not present

## 2020-09-26 DIAGNOSIS — G8251 Quadriplegia, C1-C4 complete: Secondary | ICD-10-CM | POA: Diagnosis not present

## 2020-09-26 DIAGNOSIS — J9621 Acute and chronic respiratory failure with hypoxia: Secondary | ICD-10-CM | POA: Diagnosis not present

## 2020-09-26 LAB — BASIC METABOLIC PANEL
Anion gap: 9 (ref 5–15)
BUN: 24 mg/dL — ABNORMAL HIGH (ref 8–23)
CO2: 28 mmol/L (ref 22–32)
Calcium: 8.6 mg/dL — ABNORMAL LOW (ref 8.9–10.3)
Chloride: 111 mmol/L (ref 98–111)
Creatinine, Ser: 0.59 mg/dL — ABNORMAL LOW (ref 0.61–1.24)
GFR, Estimated: >60 mL/min (ref >60)
Glucose, Bld: 132 mg/dL — ABNORMAL HIGH (ref 70–99)
Potassium: 3 mmol/L — ABNORMAL LOW (ref 3.5–5.1)
Sodium: 148 mmol/L — ABNORMAL HIGH (ref 135–145)

## 2020-09-26 NOTE — Progress Notes (Signed)
Pulmonary Critical Care Medicine [facility] [place]   PULMONARY CRITICAL CARE SERVICE  PROGRESS NOTE  Date of Service: 09/26/2020  Anthony Melton  [id number]  DOB: 11-07-1945   DOA: 09/08/2020  Referring Physician: Carron Curie, MD  HPI: Anthony Melton is a 75 y.o. male seen for follow up of Acute on Chronic Respiratory Failure.  Patient currently is on T collar has been on 21% FiO2 using the PMV  Medications: Reviewed on Rounds  Physical Exam:  Vitals: Temperature is 98.1 pulse 91 respiratory rate is 20 blood pressure is 147/50 saturations 97%  Ventilator Settings on T collar FiO2 is 21%  . General: Comfortable at this time . Eyes: Grossly normal lids, irises & conjunctiva . ENT: grossly tongue is normal . Neck: no obvious mass . Cardiovascular: S1 S2 normal no gallop . Respiratory: No rhonchi no rales noted at this time . Abdomen: soft . Skin: no rash seen on limited exam . Musculoskeletal: not rigid . Psychiatric:unable to assess . Neurologic: no seizure no involuntary movements         Lab Data:   Basic Metabolic Panel: Recent Labs  Lab 09/20/20 0413 09/21/20 0407 09/25/20 0430 09/26/20 0515  NA 147*  --  151* 148*  K 3.3* 3.3* 3.1* 3.0*  CL 107  --  110 111  CO2 30  --  30 28  GLUCOSE 119*  --  114* 132*  BUN 26*  --  28* 24*  CREATININE 0.62  --  0.63 0.59*  CALCIUM 8.9  --  9.0 8.6*    ABG: No results for input(s): PHART, PCO2ART, PO2ART, HCO3, O2SAT in the last 168 hours.  Liver Function Tests: No results for input(s): AST, ALT, ALKPHOS, BILITOT, PROT, ALBUMIN in the last 168 hours. No results for input(s): LIPASE, AMYLASE in the last 168 hours. No results for input(s): AMMONIA in the last 168 hours.  CBC: Recent Labs  Lab 09/20/20 0413 09/25/20 0430  WBC 8.6 7.4  HGB 9.6* 10.2*  HCT 31.2* 32.7*  MCV 76.5* 76.2*  PLT 216 267    Cardiac Enzymes: No results for input(s): CKTOTAL, CKMB, CKMBINDEX, TROPONINI in the  last 168 hours.  BNP (last 3 results) No results for input(s): BNP in the last 8760 hours.  ProBNP (last 3 results) No results for input(s): PROBNP in the last 8760 hours.  Radiological Exams: No results found.  Assessment/Plan Active Problems:   Acute on chronic respiratory failure with hypoxia (HCC)   Severe sepsis (HCC)   Quadriplegia, C1-C4 complete (HCC)   Tracheostomy status (HCC)   1. Acute on chronic respiratory failure with hypoxia patient is doing well with the T collar with plan is to continue with advancing as tolerated we will continue secretion management supportive care. 2. Severe sepsis resolved 3. Quadriplegia no change 4. Tracheostomy remains in place we will continue to monitor.   I have personally seen and evaluated the patient, evaluated laboratory and imaging results, formulated the assessment and plan and placed orders. The Patient requires high complexity decision making with multiple systems involvement.  Rounds were done with the Respiratory Therapy Director and Staff therapists and discussed with nursing staff also.  Yevonne Pax, MD [facility] Pulmonary Critical Care Medicine Sleep Medicine

## 2020-09-27 DIAGNOSIS — G8251 Quadriplegia, C1-C4 complete: Secondary | ICD-10-CM | POA: Diagnosis not present

## 2020-09-27 DIAGNOSIS — Z930 Tracheostomy status: Secondary | ICD-10-CM | POA: Diagnosis not present

## 2020-09-27 DIAGNOSIS — A419 Sepsis, unspecified organism: Secondary | ICD-10-CM | POA: Diagnosis not present

## 2020-09-27 DIAGNOSIS — J9621 Acute and chronic respiratory failure with hypoxia: Secondary | ICD-10-CM | POA: Diagnosis not present

## 2020-09-27 LAB — POTASSIUM
Potassium: 3.3 mmol/L — ABNORMAL LOW (ref 3.5–5.1)
Potassium: 3.3 mmol/L — ABNORMAL LOW (ref 3.5–5.1)

## 2020-09-27 NOTE — Progress Notes (Signed)
Pulmonary Critical Care Medicine [facility] [place]   PULMONARY CRITICAL CARE SERVICE  PROGRESS NOTE  Date of Service: 09/27/2020  Anthony Melton  [id number]  DOB: Sep 12, 1945   DOA: 09/08/2020  Referring Physician: Carron Curie, MD  HPI: Anthony Melton is a 75 y.o. male seen for follow up of Acute on Chronic Respiratory Failure.  Patient currently is off the ventilator on T collar is doing quite well.  Respiratory therapy reported there has been an improvement also significantly in the amount of secretions  Medications: Reviewed on Rounds  Physical Exam:  Vitals: Temperature 95.6 pulse 70 respiratory rate 21 blood pressure is 157/93 saturations 97%  Ventilator Settings on T collar room air  . General: Comfortable at this time . Eyes: Grossly normal lids, irises & conjunctiva . ENT: grossly tongue is normal . Neck: no obvious mass . Cardiovascular: S1 S2 normal no gallop . Respiratory: No rhonchi very coarse breath sound . Abdomen: soft . Skin: no rash seen on limited exam . Musculoskeletal: not rigid . Psychiatric:unable to assess . Neurologic: no seizure no involuntary movements         Lab Data:   Basic Metabolic Panel: Recent Labs  Lab 09/21/20 0407 09/25/20 0430 09/26/20 0515 09/27/20 0233  NA  --  151* 148*  --   K 3.3* 3.1* 3.0* 3.3*  CL  --  110 111  --   CO2  --  30 28  --   GLUCOSE  --  114* 132*  --   BUN  --  28* 24*  --   CREATININE  --  0.63 0.59*  --   CALCIUM  --  9.0 8.6*  --     ABG: No results for input(s): PHART, PCO2ART, PO2ART, HCO3, O2SAT in the last 168 hours.  Liver Function Tests: No results for input(s): AST, ALT, ALKPHOS, BILITOT, PROT, ALBUMIN in the last 168 hours. No results for input(s): LIPASE, AMYLASE in the last 168 hours. No results for input(s): AMMONIA in the last 168 hours.  CBC: Recent Labs  Lab 09/25/20 0430  WBC 7.4  HGB 10.2*  HCT 32.7*  MCV 76.2*  PLT 267    Cardiac Enzymes: No  results for input(s): CKTOTAL, CKMB, CKMBINDEX, TROPONINI in the last 168 hours.  BNP (last 3 results) No results for input(s): BNP in the last 8760 hours.  ProBNP (last 3 results) No results for input(s): PROBNP in the last 8760 hours.  Radiological Exams: No results found.  Assessment/Plan Active Problems:   Acute on chronic respiratory failure with hypoxia (HCC)   Severe sepsis (HCC)   Quadriplegia, C1-C4 complete (HCC)   Tracheostomy status (HCC)   1. Acute on chronic respiratory failure hypoxia we will try to proceed to capping trials today. 2. Severe sepsis treated resolving 3. Quadriplegia no change 4. Tracheostomy remains in place   I have personally seen and evaluated the patient, evaluated laboratory and imaging results, formulated the assessment and plan and placed orders. The Patient requires high complexity decision making with multiple systems involvement.  Rounds were done with the Respiratory Therapy Director and Staff therapists and discussed with nursing staff also.  Yevonne Pax, MD [facility] Pulmonary Critical Care Medicine Sleep Medicine

## 2020-09-28 ENCOUNTER — Other Ambulatory Visit (HOSPITAL_COMMUNITY): Payer: Medicare Other

## 2020-09-28 ENCOUNTER — Encounter (HOSPITAL_BASED_OUTPATIENT_CLINIC_OR_DEPARTMENT_OTHER): Payer: Medicare Other

## 2020-09-28 DIAGNOSIS — M7989 Other specified soft tissue disorders: Secondary | ICD-10-CM

## 2020-09-28 DIAGNOSIS — Z930 Tracheostomy status: Secondary | ICD-10-CM | POA: Diagnosis not present

## 2020-09-28 DIAGNOSIS — J9621 Acute and chronic respiratory failure with hypoxia: Secondary | ICD-10-CM | POA: Diagnosis not present

## 2020-09-28 DIAGNOSIS — G8251 Quadriplegia, C1-C4 complete: Secondary | ICD-10-CM | POA: Diagnosis not present

## 2020-09-28 DIAGNOSIS — A419 Sepsis, unspecified organism: Secondary | ICD-10-CM | POA: Diagnosis not present

## 2020-09-28 LAB — BASIC METABOLIC PANEL
Anion gap: 7 (ref 5–15)
BUN: 24 mg/dL — ABNORMAL HIGH (ref 8–23)
CO2: 30 mmol/L (ref 22–32)
Calcium: 8.5 mg/dL — ABNORMAL LOW (ref 8.9–10.3)
Chloride: 105 mmol/L (ref 98–111)
Creatinine, Ser: 0.6 mg/dL — ABNORMAL LOW (ref 0.61–1.24)
GFR, Estimated: >60 mL/min (ref >60)
Glucose, Bld: 125 mg/dL — ABNORMAL HIGH (ref 70–99)
Potassium: 3.5 mmol/L (ref 3.5–5.1)
Sodium: 142 mmol/L (ref 135–145)

## 2020-09-28 NOTE — Progress Notes (Signed)
Left upper extremity venous duplex has been completed. Preliminary results can be found in CV Proc through chart review.   09/28/20 1:50 PM Olen Cordial RVT

## 2020-09-28 NOTE — Progress Notes (Signed)
Pulmonary Critical Care Medicine [facility] [place]   PULMONARY CRITICAL CARE SERVICE  PROGRESS NOTE  Date of Service: 09/28/2020  [name]  GNF:[id number]  DOB: 25-Oct-1945   DOA: 09/08/2020  Referring Physician: Carron Curie, MD  HPI: Anthony Melton is a 75 y.o. male seen for follow up of Acute on Chronic Respiratory Failure.  Patient is capping doing fairly well right now on room air  Medications: Reviewed on Rounds  Physical Exam:  Vitals: Temperature 97.7 pulse 96 respiratory 20 blood pressure is 149/75 saturations 97%  Ventilator Settings capping off the ventilator right now on room air  . General: Comfortable at this time . Eyes: Grossly normal lids, irises & conjunctiva . ENT: grossly tongue is normal . Neck: no obvious mass . Cardiovascular: S1 S2 normal no gallop . Respiratory: No rhonchi very coarse breath . Abdomen: soft . Skin: no rash seen on limited exam . Musculoskeletal: not rigid . Psychiatric:unable to assess . Neurologic: no seizure no involuntary movements         Lab Data:   Basic Metabolic Panel: Recent Labs  Lab 09/25/20 0430 09/26/20 0515 09/27/20 0233 09/27/20 1739 09/28/20 0520  NA 151* 148*  --   --  142  K 3.1* 3.0* 3.3* 3.3* 3.5  CL 110 111  --   --  105  CO2 30 28  --   --  30  GLUCOSE 114* 132*  --   --  125*  BUN 28* 24*  --   --  24*  CREATININE 0.63 0.59*  --   --  0.60*  CALCIUM 9.0 8.6*  --   --  8.5*    ABG: No results for input(s): PHART, PCO2ART, PO2ART, HCO3, O2SAT in the last 168 hours.  Liver Function Tests: No results for input(s): AST, ALT, ALKPHOS, BILITOT, PROT, ALBUMIN in the last 168 hours. No results for input(s): LIPASE, AMYLASE in the last 168 hours. No results for input(s): AMMONIA in the last 168 hours.  CBC: Recent Labs  Lab 09/25/20 0430  WBC 7.4  HGB 10.2*  HCT 32.7*  MCV 76.2*  PLT 267    Cardiac Enzymes: No results for input(s): CKTOTAL, CKMB, CKMBINDEX,  TROPONINI in the last 168 hours.  BNP (last 3 results) No results for input(s): BNP in the last 8760 hours.  ProBNP (last 3 results) No results for input(s): PROBNP in the last 8760 hours.  Radiological Exams: No results found.  Assessment/Plan Active Problems:   Acute on chronic respiratory failure with hypoxia (HCC)   Severe sepsis (HCC)   Quadriplegia, C1-C4 complete (HCC)   Tracheostomy status (HCC)   1. Acute on chronic respiratory failure with hypoxia we will continue with capping trials continue secretion management supportive care. 2. Severe sepsis treated resolving 3. Quadriplegia no change we will continue present therapy 4. Tracheostomy doing fine so far with capping trials   I have personally seen and evaluated the patient, evaluated laboratory and imaging results, formulated the assessment and plan and placed orders. The Patient requires high complexity decision making with multiple systems involvement.  Rounds were done with the Respiratory Therapy Director and Staff therapists and discussed with nursing staff also.  Yevonne Pax, MD [facility] Pulmonary Critical Care Medicine Sleep Medicine

## 2020-09-29 DIAGNOSIS — Z930 Tracheostomy status: Secondary | ICD-10-CM | POA: Diagnosis not present

## 2020-09-29 DIAGNOSIS — G8251 Quadriplegia, C1-C4 complete: Secondary | ICD-10-CM | POA: Diagnosis not present

## 2020-09-29 DIAGNOSIS — A419 Sepsis, unspecified organism: Secondary | ICD-10-CM | POA: Diagnosis not present

## 2020-09-29 DIAGNOSIS — J9621 Acute and chronic respiratory failure with hypoxia: Secondary | ICD-10-CM | POA: Diagnosis not present

## 2020-09-29 NOTE — Progress Notes (Signed)
Pulmonary Critical Care Medicine [facility] [place]   PULMONARY CRITICAL CARE SERVICE  PROGRESS NOTE  Date of Service: 09/29/2020  Barnaby Rippeon  GEX:[id number]  DOB: 10-Apr-1946   DOA: 09/08/2020  Referring Physician: Carron Curie, MD  HPI: Coe Angelos is a 75 y.o. male seen for follow up of Acute on Chronic Respiratory Failure.  Patient is capping on room air doing fairly well.  Still with a somewhat weak cough  Medications: Reviewed on Rounds  Physical Exam:  Vitals: Temperature is 96.0 pulse 69 respiratory 19 blood pressure is 123/71 saturations 99%  Ventilator Settings capping on room air  . General: Comfortable at this time . Eyes: Grossly normal lids, irises & conjunctiva . ENT: grossly tongue is normal . Neck: no obvious mass . Cardiovascular: S1 S2 normal no gallop . Respiratory: Few scattered rhonchi are noted . Abdomen: soft . Skin: no rash seen on limited exam . Musculoskeletal: not rigid . Psychiatric:unable to assess . Neurologic: no seizure no involuntary movements         Lab Data:   Basic Metabolic Panel: Recent Labs  Lab 09/25/20 0430 09/26/20 0515 09/27/20 0233 09/27/20 1739 09/28/20 0520  NA 151* 148*  --   --  142  K 3.1* 3.0* 3.3* 3.3* 3.5  CL 110 111  --   --  105  CO2 30 28  --   --  30  GLUCOSE 114* 132*  --   --  125*  BUN 28* 24*  --   --  24*  CREATININE 0.63 0.59*  --   --  0.60*  CALCIUM 9.0 8.6*  --   --  8.5*    ABG: No results for input(s): PHART, PCO2ART, PO2ART, HCO3, O2SAT in the last 168 hours.  Liver Function Tests: No results for input(s): AST, ALT, ALKPHOS, BILITOT, PROT, ALBUMIN in the last 168 hours. No results for input(s): LIPASE, AMYLASE in the last 168 hours. No results for input(s): AMMONIA in the last 168 hours.  CBC: Recent Labs  Lab 09/25/20 0430  WBC 7.4  HGB 10.2*  HCT 32.7*  MCV 76.2*  PLT 267    Cardiac Enzymes: No results for input(s): CKTOTAL, CKMB, CKMBINDEX,  TROPONINI in the last 168 hours.  BNP (last 3 results) No results for input(s): BNP in the last 8760 hours.  ProBNP (last 3 results) No results for input(s): PROBNP in the last 8760 hours.  Radiological Exams: VAS [place] UPPER EXTREMITY VENOUS DUPLEX  Result Date: 09/28/2020 UPPER VENOUS STUDY  Indications: Swelling Risk Factors: None identified. Limitations: Poor ultrasound/tissue interface, line, bandages and patient immobility. Comparison Study: No prior studies. Performing Technologist: Chanda Busing RVT  Examination Guidelines: A complete evaluation includes B-mode imaging, spectral Doppler, color Doppler, and power Doppler as needed of all accessible portions of each vessel. Bilateral testing is considered an integral part of a complete examination. Limited examinations for reoccurring indications may be performed as noted.  Right Findings: +----------+------------+---------+-----------+----------+-------+ RIGHT     CompressiblePhasicitySpontaneousPropertiesSummary +----------+------------+---------+-----------+----------+-------+ Subclavian    Full       Yes       Yes                      +----------+------------+---------+-----------+----------+-------+  Left Findings: +----------+------------+---------+-----------+----------+-------+ LEFT      CompressiblePhasicitySpontaneousPropertiesSummary +----------+------------+---------+-----------+----------+-------+ IJV           Full       Yes       Yes                      +----------+------------+---------+-----------+----------+-------+  Subclavian    Full       Yes       Yes                      +----------+------------+---------+-----------+----------+-------+ Axillary      Full       Yes       Yes                      +----------+------------+---------+-----------+----------+-------+ Brachial      Full       Yes       Yes                       +----------+------------+---------+-----------+----------+-------+ Radial        Full                                          +----------+------------+---------+-----------+----------+-------+ Ulnar         Full                                          +----------+------------+---------+-----------+----------+-------+ Cephalic      Full                                          +----------+------------+---------+-----------+----------+-------+ Basilic       Full                                          +----------+------------+---------+-----------+----------+-------+  Summary:  Right: No evidence of thrombosis in the subclavian.  Left: No evidence of deep vein thrombosis in the upper extremity. No evidence of superficial vein thrombosis in the upper extremity.  *See table(s) above for measurements and observations.  Diagnosing physician: Heath Lark Electronically signed by Heath Lark on 09/28/2020 at 4:49:01 PM.    Final     Assessment/Plan Active Problems:   Acute on chronic respiratory failure with hypoxia (HCC)   Severe sepsis (HCC)   Quadriplegia, C1-C4 complete (HCC)   Tracheostomy status (HCC)   1. Acute on chronic respiratory failure hypoxia plan is to continue with capping trials continue secretion management supportive care. 2. Severe sepsis resolved 3. Quadriplegia no change 4. Tracheostomy will remain in place for now   I have personally seen and evaluated the patient, evaluated laboratory and imaging results, formulated the assessment and plan and placed orders. The Patient requires high complexity decision making with multiple systems involvement.  Rounds were done with the Respiratory Therapy Director and Staff therapists and discussed with nursing staff also.  Yevonne Pax, MD [facility] Pulmonary Critical Care Medicine Sleep Medicine

## 2020-09-30 NOTE — Consult Note (Signed)
Infectious Disease Consultation   Anthony Melton  EVO:[id number]  DOB: September 10, 1945  DOA: 09/08/2020  Requesting physician: Dr. Manson Passey  Reason for consultation: Antibiotic recommendations    History of Present Illness: Anthony Melton is an 75 y.o. male with medical history significant of hypertension, hyperlipidemia.  He apparently presented to [facility] on 08/07/2020 with generalized weakness, shortness of breath, upper respiratory tract symptoms for a week.  He was having difficulty walking, shuffling gait, intermittent backslash neck pain, right greater than left upper extremity weakness, inability to urinate.  He was transferred to [facility] health.  At the time of examination there he was found to have no sensation below the waist, arm weakness.  He was started on IV vancomycin, cefepime.  Blood cultures showed staph aureus.  Urine also showed staph aureus.  He did not have any reflexes.  Due to worsening shortness of breath he was intubated.  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  MRI showed large rim-enhancing epidural abscess extending from the superior endplate of C2 inferiorly to the endplate of T8 with mass-effect upon the thoracic spinal cord.  Abscess also appeared to extend into the left C6-C7 neural foraminal with enhancing soft tissue along the left lateral aspect of the thoracic spinal cord with no evidence of spinal abscess.  However, there was a 3.9 x 1 x 2.1 cm epidural abscess extending from the dens through the inferior endplate of C3 deviating the cord dorsally with severe spinal canal stenosis with a second small epidural abscess extending from the superior endplate of C4 through the inferior endplate of C5.  MRI of the head also showed pansinusitis.  Sputum cultures also had MSSA.  Neurosurgery was consulted.  He had laminectomy and washout of the cervical spine epidural abscess on 08/11/2020.  Thoracic laminectomy  was not performed.  Intraoperative cultures showed MSSA.  CT soft tissue of the neck showed enhancing collection in the ventral aspect of the spinal canal at C1-C3.  MRI of the C-spine showed residual enhancing ventral epidural abscess with moderate residual central canal stenosis, minimally decreased with phlegmon at C4-C5 as well as ventral epidural abscess from T3-T9.  His blood cultures remained positive for MSSA.  On 08/22/2020 patient had repeat I&D of the cervical/thoracic spine.  Operative cultures did not show any growth.  He had tracheostomy done on 08/26/2020.  Infectious disease was consulted for the persistent positive blood cultures and he was treated with a combination of daptomycin and oxacillin. He also has a suprapubic cath.  He initially was on pressors but later weaned off pressors.  TTE and TEE done on 08/13/2020 on 08/16/2020 did not show any vegetations.  He was treated with dual antibiotics due to persistent bacteremia. His cultures finally became negative on 08/27/2020.  Due to his complex medical problems he was transferred and admitted to [facility] on 09/08/2020.  He is unfortunately quadriplegic with severe weakness of his upper extremities and his lower extremities.  He has a trach that scab but continues to have increased secretions.  On scopolamine patch.  He had a DVT of his right upper extremity and started on Eliquis.  No mention of any fevers.   Review of Systems:  Review of systems negative except as mentioned above in the HPI.  Past Medical History: Past Medical History:  Diagnosis Date  . Acute on chronic respiratory failure with hypoxia (HCC)   . Quadriplegia, C1-C4 complete (HCC)   .  Severe sepsis (HCC)   . Tracheostomy status (HCC)   Hypertension, hyperlipidemia, epidural abscess   Past Surgical History: Tracheostomy, PEG tube placement  Allergies: No known drug allergies  Social History:  has no history on file for tobacco use, alcohol use,  and drug use.   Family History: History of hypertension  Physical Exam: Vitals: Temperature 98.1, pulse 69, respiratory rate 20, blood pressure 127/76, pulse oximetry 98% Constitutional: Ill-appearing male, awake and oriented Head: Atraumatic, normocephalic Eyes: PERLA, EOMI, irises appear normal, anicteric sclera,  ENMT: external ears and nose appear normal, normal hearing, Lips appears normal, moist oral mucosa Neck: Has a trach in place CVS: S1-S2  Respiratory: Decreased breath sounds lower lobes, occasional rhonchi, no wheezing Abdomen: soft, positive bowel sounds  Musculoskeletal: He has quadriplegia with severe weakness of both upper and lower extremities, left upper extremity swelling, no lower extremity edema Neuro: Quadriplegic Psych: stable mood and affect Skin: no rashes  Data reviewed:  I have personally reviewed following labs and imaging studies Labs:  CBC: Recent Labs  Lab 09/25/20 0430  WBC 7.4  HGB 10.2*  HCT 32.7*  MCV 76.2*  PLT 267    Basic Metabolic Panel: Recent Labs  Lab 09/25/20 0430 09/26/20 0515 09/27/20 0233 09/27/20 1739 09/28/20 0520  NA 151* 148*  --   --  142  K 3.1* 3.0*   < > 3.3* 3.5  CL 110 111  --   --  105  CO2 30 28  --   --  30  GLUCOSE 114* 132*  --   --  125*  BUN 28* 24*  --   --  24*  CREATININE 0.63 0.59*  --   --  0.60*  CALCIUM 9.0 8.6*  --   --  8.5*   < > = values in this interval not displayed.   GFR CrCl cannot be calculated (Unknown ideal weight.). Liver Function Tests: No results for input(s): AST, ALT, ALKPHOS, BILITOT, PROT, ALBUMIN in the last 168 hours. No results for input(s): LIPASE, AMYLASE in the last 168 hours. No results for input(s): AMMONIA in the last 168 hours. Coagulation profile No results for input(s): INR, PROTIME in the last 168 hours.  Cardiac Enzymes: No results for input(s): CKTOTAL, CKMB, CKMBINDEX, TROPONINI in the last 168 hours. BNP: Invalid input(s): POCBNP CBG: No results  for input(s): GLUCAP in the last 168 hours. D-Dimer No results for input(s): DDIMER in the last 72 hours. Hgb A1c No results for input(s): HGBA1C in the last 72 hours. Lipid Profile No results for input(s): CHOL, HDL, LDLCALC, TRIG, CHOLHDL, LDLDIRECT in the last 72 hours. Thyroid function studies No results for input(s): TSH, T4TOTAL, T3FREE, THYROIDAB in the last 72 hours.  Invalid input(s): FREET3 Anemia work up No results for input(s): VITAMINB12, FOLATE, FERRITIN, TIBC, IRON, RETICCTPCT in the last 72 hours. Urinalysis    Component Value Date/Time   COLORURINE YELLOW 09/09/2020 1000   APPEARANCEUR CLEAR 09/09/2020 1000   LABSPEC 1.020 09/09/2020 1000   PHURINE 8.0 09/09/2020 1000   GLUCOSEU NEGATIVE 09/09/2020 1000   HGBUR SMALL (A) 09/09/2020 1000   BILIRUBINUR NEGATIVE 09/09/2020 1000   KETONESUR NEGATIVE 09/09/2020 1000   PROTEINUR NEGATIVE 09/09/2020 1000   NITRITE NEGATIVE 09/09/2020 1000   LEUKOCYTESUR TRACE (A) 09/09/2020 1000     Sepsis Labs Invalid input(s): PROCALCITONIN,  WBC,  LACTICIDVEN Microbiology No results found for this or any previous visit (from the past 240 hour(s)).     Inpatient Medications:   Please  see MAR   Radiological Exams on Admission: No results found.  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  He has a trach in place which is currently capped.  He is on scopolamine patch due to increased secretions.  He is high risk for  aspiration and aspiration pneumonia/tracheobronchitis.  Currently on treatment with IV oxacillin for the epidural abscess.  Sepsis with MSSA: He had persistent MSSA bacteremia at the acute facility.  He was also hypotensive and was on pressors.  He continued to be bacteremic therefore he needed dual antibiotic treatment with oxacillin, daptomycin. TTE 1/17 and TEE 1/20 did not show any vegetations. His blood cultures finally cleared on 08/27/2020.  He has been on the dual antibiotic treatment until 09/10/2020 after which he was switched to monotherapy with nafcillin.  Will order blood cultures just to make sure he has completely cleared.  Continue to monitor closely.  If he starts having worsening fevers suggest to send for repeat pancultures.  Please monitor CBC, CMP especially the LFTs while on antibiotics.  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 Thoracic component was not washed out. Repeat imaging 1/21 at the acute facility showed persistent infection in cervical and thoracic spine.  He underwent cervical laminectomy with abscess removal on 08/22/2020.  He continues to be on IV nafcillin. He will need 8 weeks of treatment for vertebral osteomyelitis with tentative end date 10/22/2020. Unfortunately continues to be quadriplegic, low likelihood of good neurological recovery at this time.   Right upper extremity DVT: On Eliquis per the primary team.  Protein calorie malnutrition/dysphagia: He is currently on tube feeds.  Unfortunately due to his dysphagia he is high risk for aspiration and aspiration pneumonia despite being on antibiotics.  Urinary retention: From the quadriplegia.  He is status post suprapubic catheter placement.  Unfortunately due to the suprapubic catheter he is high risk for recurrent UTIs.  Continue local care of the catheter.  Due to his complex medical problems he is very high risk for  worsening and decompensation.   Thank you for this consultation.  Plan of care discussed with the patient, primary team and pharmacy.  Vonzella Nipple M.D. 09/30/2020, 6:15 PM

## 2020-10-02 DIAGNOSIS — G8251 Quadriplegia, C1-C4 complete: Secondary | ICD-10-CM | POA: Diagnosis not present

## 2020-10-02 DIAGNOSIS — A419 Sepsis, unspecified organism: Secondary | ICD-10-CM | POA: Diagnosis not present

## 2020-10-02 DIAGNOSIS — J9621 Acute and chronic respiratory failure with hypoxia: Secondary | ICD-10-CM | POA: Diagnosis not present

## 2020-10-02 DIAGNOSIS — Z930 Tracheostomy status: Secondary | ICD-10-CM | POA: Diagnosis not present

## 2020-10-02 LAB — CBC
HCT: 28.3 % — ABNORMAL LOW (ref 39.0–52.0)
Hemoglobin: 8.7 g/dL — ABNORMAL LOW (ref 13.0–17.0)
MCH: 23.3 pg — ABNORMAL LOW (ref 26.0–34.0)
MCHC: 30.7 g/dL (ref 30.0–36.0)
MCV: 75.7 fL — ABNORMAL LOW (ref 80.0–100.0)
Platelets: 281 10*3/uL (ref 150–400)
RBC: 3.74 MIL/uL — ABNORMAL LOW (ref 4.22–5.81)
RDW: 20.3 % — ABNORMAL HIGH (ref 11.5–15.5)
WBC: 9.4 10*3/uL (ref 4.0–10.5)
nRBC: 0.2 % (ref 0.0–0.2)

## 2020-10-02 LAB — BASIC METABOLIC PANEL
Anion gap: 8 (ref 5–15)
BUN: 28 mg/dL — ABNORMAL HIGH (ref 8–23)
CO2: 31 mmol/L (ref 22–32)
Calcium: 8.6 mg/dL — ABNORMAL LOW (ref 8.9–10.3)
Chloride: 108 mmol/L (ref 98–111)
Creatinine, Ser: 0.75 mg/dL (ref 0.61–1.24)
GFR, Estimated: >60 mL/min (ref >60)
Glucose, Bld: 109 mg/dL — ABNORMAL HIGH (ref 70–99)
Potassium: 2.8 mmol/L — ABNORMAL LOW (ref 3.5–5.1)
Sodium: 147 mmol/L — ABNORMAL HIGH (ref 135–145)

## 2020-10-02 LAB — MAGNESIUM: Magnesium: 2.2 mg/dL (ref 1.7–2.4)

## 2020-10-02 NOTE — Progress Notes (Signed)
Pulmonary Critical Care Medicine [facility] [place]   PULMONARY CRITICAL CARE SERVICE  PROGRESS NOTE  Date of Service: 10/02/2020  Anthony Melton  PYK:[id number]  DOB: 1946-01-24   DOA: 09/08/2020  Referring Physician: Carron Curie, MD  HPI: Anthony Melton is a 75 y.o. male seen for follow up of Acute on Chronic Respiratory Failure.  Patient currently is capping today is the fourth day of capping however respiratory therapy reports that he is requiring frequent suctioning.  It well I would therefore hold off on decannulation for now  Medications: Reviewed on Rounds  Physical Exam:  Vitals: Temperature is 97.0 pulse 99 respiratory 18 blood pressure is 126/70 saturations 100%  Ventilator Settings capping for 4 days   General: Comfortable at this time  Eyes: Grossly normal lids, irises & conjunctiva  ENT: grossly tongue is normal  Neck: no obvious mass  Cardiovascular: S1 S2 normal no gallop  Respiratory: No rhonchi very coarse breath sounds  Abdomen: soft  Skin: no rash seen on limited exam  Musculoskeletal: not rigid  Psychiatric:unable to assess  Neurologic: no seizure no involuntary movements         Lab Data:   Basic Metabolic Panel: Recent Labs  Lab 09/26/20 0515 09/27/20 0233 09/27/20 1739 09/28/20 0520 10/02/20 0547  NA 148*  --   --  142 147*  K 3.0* 3.3* 3.3* 3.5 2.8*  CL 111  --   --  105 108  CO2 28  --   --  30 31  GLUCOSE 132*  --   --  125* 109*  BUN 24*  --   --  24* 28*  CREATININE 0.59*  --   --  0.60* 0.75  CALCIUM 8.6*  --   --  8.5* 8.6*  MG  --   --   --   --  2.2    ABG: No results for input(s): PHART, PCO2ART, PO2ART, HCO3, O2SAT in the last 168 hours.  Liver Function Tests: No results for input(s): AST, ALT, ALKPHOS, BILITOT, PROT, ALBUMIN in the last 168 hours. No results for input(s): LIPASE, AMYLASE in the last 168 hours. No results for input(s): AMMONIA in the last 168 hours.  CBC: Recent Labs   Lab 10/02/20 0547  WBC 9.4  HGB 8.7*  HCT 28.3*  MCV 75.7*  PLT 281    Cardiac Enzymes: No results for input(s): CKTOTAL, CKMB, CKMBINDEX, TROPONINI in the last 168 hours.  BNP (last 3 results) No results for input(s): BNP in the last 8760 hours.  ProBNP (last 3 results) No results for input(s): PROBNP in the last 8760 hours.  Radiological Exams: No results found.  Assessment/Plan Active Problems:   Acute on chronic respiratory failure with hypoxia (HCC)   Severe sepsis (HCC)   Quadriplegia, C1-C4 complete (HCC)   Tracheostomy status (HCC)   1. Acute on chronic respiratory failure hypoxia plan is to continue with capping for now because of the increased secretions and frequent suctioning requirements holding off on decannulation 2. Severe sepsis treated resolving 3. Quadriplegia no change 4. Tracheostomy will remain in place for now   I have personally seen and evaluated the patient, evaluated laboratory and imaging results, formulated the assessment and plan and placed orders. The Patient requires high complexity decision making with multiple systems involvement.  Rounds were done with the Respiratory Therapy Director and Staff therapists and discussed with nursing staff also.  Yevonne Pax, MD [facility] Pulmonary Critical Care Medicine Sleep Medicine

## 2020-10-03 DIAGNOSIS — Z930 Tracheostomy status: Secondary | ICD-10-CM | POA: Diagnosis not present

## 2020-10-03 DIAGNOSIS — J9621 Acute and chronic respiratory failure with hypoxia: Secondary | ICD-10-CM | POA: Diagnosis not present

## 2020-10-03 DIAGNOSIS — A419 Sepsis, unspecified organism: Secondary | ICD-10-CM | POA: Diagnosis not present

## 2020-10-03 DIAGNOSIS — G8251 Quadriplegia, C1-C4 complete: Secondary | ICD-10-CM | POA: Diagnosis not present

## 2020-10-03 LAB — CBC
HCT: 27.3 % — ABNORMAL LOW (ref 39.0–52.0)
Hemoglobin: 8.8 g/dL — ABNORMAL LOW (ref 13.0–17.0)
MCH: 24 pg — ABNORMAL LOW (ref 26.0–34.0)
MCHC: 32.2 g/dL (ref 30.0–36.0)
MCV: 74.4 fL — ABNORMAL LOW (ref 80.0–100.0)
Platelets: 296 10*3/uL (ref 150–400)
RBC: 3.67 MIL/uL — ABNORMAL LOW (ref 4.22–5.81)
RDW: 20.1 % — ABNORMAL HIGH (ref 11.5–15.5)
WBC: 9.5 10*3/uL (ref 4.0–10.5)
nRBC: 0.4 % — ABNORMAL HIGH (ref 0.0–0.2)

## 2020-10-03 NOTE — Progress Notes (Signed)
Pulmonary Critical Care Medicine [facility] [place]   PULMONARY CRITICAL CARE SERVICE  PROGRESS NOTE  Date of Service: 10/03/2020  Anthony Melton  JSE:[id number]  DOB: 1946-01-23   DOA: 09/08/2020  Referring Physician: Carron Curie, MD  HPI: Anthony Melton is a 75 y.o. male seen for follow up of Acute on Chronic Respiratory Failure.  Patient currently is capping has been on room air no distress at this time.  Medications: Reviewed on Rounds  Physical Exam:  Vitals: Temperature 98.8 pulse 86 respiratory rate is 20 blood pressure is 90/52 saturations 99%  Ventilator Settings capping of the ventilator  . General: Comfortable at this time . Eyes: Grossly normal lids, irises & conjunctiva . ENT: grossly tongue is normal . Neck: no obvious mass . Cardiovascular: S1 S2 normal no gallop . Respiratory: Scattered rhonchi noted bilaterally . Abdomen: soft . Skin: no rash seen on limited exam . Musculoskeletal: not rigid . Psychiatric:unable to assess . Neurologic: no seizure no involuntary movements         Lab Data:   Basic Metabolic Panel: Recent Labs  Lab 09/27/20 0233 09/27/20 1739 09/28/20 0520 10/02/20 0547  NA  --   --  142 147*  K 3.3* 3.3* 3.5 2.8*  CL  --   --  105 108  CO2  --   --  30 31  GLUCOSE  --   --  125* 109*  BUN  --   --  24* 28*  CREATININE  --   --  0.60* 0.75  CALCIUM  --   --  8.5* 8.6*  MG  --   --   --  2.2    ABG: No results for input(s): PHART, PCO2ART, PO2ART, HCO3, O2SAT in the last 168 hours.  Liver Function Tests: No results for input(s): AST, ALT, ALKPHOS, BILITOT, PROT, ALBUMIN in the last 168 hours. No results for input(s): LIPASE, AMYLASE in the last 168 hours. No results for input(s): AMMONIA in the last 168 hours.  CBC: Recent Labs  Lab 10/02/20 0547 10/03/20 0701  WBC 9.4 9.5  HGB 8.7* 8.8*  HCT 28.3* 27.3*  MCV 75.7* 74.4*  PLT 281 296    Cardiac Enzymes: No results for input(s): CKTOTAL, CKMB,  CKMBINDEX, TROPONINI in the last 168 hours.  BNP (last 3 results) No results for input(s): BNP in the last 8760 hours.  ProBNP (last 3 results) No results for input(s): PROBNP in the last 8760 hours.  Radiological Exams: No results found.  Assessment/Plan Active Problems:   Acute on chronic respiratory failure with hypoxia (HCC)   Severe sepsis (HCC)   Quadriplegia, C1-C4 complete (HCC)   Tracheostomy status (HCC)   1. Acute on chronic respiratory failure hypoxia patient is doing fine with capping however still requiring suctioning poor weak cough therefore low decannulation 2. Severe sepsis treated in resolution 3. Quadriplegia but no change 4. Tracheostomy remains in place   I have personally seen and evaluated the patient, evaluated laboratory and imaging results, formulated the assessment and plan and placed orders. The Patient requires high complexity decision making with multiple systems involvement.  Rounds were done with the Respiratory Therapy Director and Staff therapists and discussed with nursing staff also.  Yevonne Pax, MD [facility] Pulmonary Critical Care Medicine Sleep Medicine

## 2020-10-04 DIAGNOSIS — G8251 Quadriplegia, C1-C4 complete: Secondary | ICD-10-CM | POA: Diagnosis not present

## 2020-10-04 DIAGNOSIS — A419 Sepsis, unspecified organism: Secondary | ICD-10-CM | POA: Diagnosis not present

## 2020-10-04 DIAGNOSIS — Z930 Tracheostomy status: Secondary | ICD-10-CM | POA: Diagnosis not present

## 2020-10-04 DIAGNOSIS — J9621 Acute and chronic respiratory failure with hypoxia: Secondary | ICD-10-CM | POA: Diagnosis not present

## 2020-10-04 LAB — BASIC METABOLIC PANEL
Anion gap: 8 (ref 5–15)
BUN: 24 mg/dL — ABNORMAL HIGH (ref 8–23)
CO2: 31 mmol/L (ref 22–32)
Calcium: 8.8 mg/dL — ABNORMAL LOW (ref 8.9–10.3)
Chloride: 109 mmol/L (ref 98–111)
Creatinine, Ser: 0.65 mg/dL (ref 0.61–1.24)
GFR, Estimated: >60 mL/min (ref >60)
Glucose, Bld: 98 mg/dL (ref 70–99)
Potassium: 3.4 mmol/L — ABNORMAL LOW (ref 3.5–5.1)
Sodium: 148 mmol/L — ABNORMAL HIGH (ref 135–145)

## 2020-10-04 NOTE — Progress Notes (Signed)
Pulmonary Critical Care Medicine [facility] [place]   PULMONARY CRITICAL CARE SERVICE  PROGRESS NOTE  Date of Service: 10/04/2020  Anthony Melton  [id number]:[id number]  DOB: [birth year]   DOA: 09/08/2020  Referring Physician: Carron Curie, MD  HPI: Anthony Melton is a 75 y.o. male seen for follow up of Acute on Chronic Respiratory Failure.  Patient is capping currently on room air secretions are copious  Medications: Reviewed on Rounds  Physical Exam:  Vitals: Temperature 97.7 pulse 100 respiratory rate 20 blood pressure is 123/71 saturations 99%  Ventilator Settings capping on room air good saturations  . General: Comfortable at this time . Eyes: Grossly normal lids, irises & conjunctiva . ENT: grossly tongue is normal . Neck: no obvious mass . Cardiovascular: S1 S2 normal no gallop . Respiratory: No rhonchi no rales are noted at this time . Abdomen: soft . Skin: no rash seen on limited exam . Musculoskeletal: not rigid . Psychiatric:unable to assess . Neurologic: no seizure no involuntary movements         Lab Data:   Basic Metabolic Panel: Recent Labs  Lab 09/27/20 1739 09/28/20 0520 10/02/20 0547 10/04/20 0348  NA  --  142 147* 148*  K 3.3* 3.5 2.8* 3.4*  CL  --  105 108 109  CO2  --  30 31 31   GLUCOSE  --  125* 109* 98  BUN  --  24* 28* 24*  CREATININE  --  0.60* 0.75 0.65  CALCIUM  --  8.5* 8.6* 8.8*  MG  --   --  2.2  --     ABG: No results for input(s): PHART, PCO2ART, PO2ART, HCO3, O2SAT in the last 168 hours.  Liver Function Tests: No results for input(s): AST, ALT, ALKPHOS, BILITOT, PROT, ALBUMIN in the last 168 hours. No results for input(s): LIPASE, AMYLASE in the last 168 hours. No results for input(s): AMMONIA in the last 168 hours.  CBC: Recent Labs  Lab 10/02/20 0547 10/03/20 0701  WBC 9.4 9.5  HGB 8.7* 8.8*  HCT 28.3* 27.3*  MCV 75.7* 74.4*  PLT 281 296    Cardiac Enzymes: No results for input(s): CKTOTAL, CKMB,  CKMBINDEX, TROPONINI in the last 168 hours.  BNP (last 3 results) No results for input(s): BNP in the last 8760 hours.  ProBNP (last 3 results) No results for input(s): PROBNP in the last 8760 hours.  Radiological Exams: No results found.  Assessment/Plan Active Problems:   Acute on chronic respiratory failure with hypoxia (HCC)   Severe sepsis (HCC)   Quadriplegia, C1-C4 complete (HCC)   Tracheostomy status (HCC)   1. Acute on chronic respiratory failure hypoxia we will continue with capping trials titrate oxygen as tolerated continue pulmonary toilet 2. Severe sepsis resolved hemodynamics are stable 3. Quadriplegia no change 4. Tracheostomy remains in place at this time   I have personally seen and evaluated the patient, evaluated laboratory and imaging results, formulated the assessment and plan and placed orders. The Patient requires high complexity decision making with multiple systems involvement.  Rounds were done with the Respiratory Therapy Director and Staff therapists and discussed with nursing staff also.  12/03/20, MD [facility] Pulmonary Critical Care Medicine Sleep Medicine

## 2020-10-05 ENCOUNTER — Other Ambulatory Visit (HOSPITAL_COMMUNITY): Payer: Medicare Other

## 2020-10-05 DIAGNOSIS — G8251 Quadriplegia, C1-C4 complete: Secondary | ICD-10-CM | POA: Diagnosis not present

## 2020-10-05 DIAGNOSIS — J9621 Acute and chronic respiratory failure with hypoxia: Secondary | ICD-10-CM | POA: Diagnosis not present

## 2020-10-05 DIAGNOSIS — A419 Sepsis, unspecified organism: Secondary | ICD-10-CM | POA: Diagnosis not present

## 2020-10-05 DIAGNOSIS — Z930 Tracheostomy status: Secondary | ICD-10-CM | POA: Diagnosis not present

## 2020-10-05 LAB — CULTURE, BLOOD (ROUTINE X 2)
Culture: NO GROWTH
Culture: NO GROWTH
Special Requests: ADEQUATE

## 2020-10-05 NOTE — Progress Notes (Signed)
Pulmonary Critical Care Medicine [facility] [place]   PULMONARY CRITICAL CARE SERVICE  PROGRESS NOTE  Date of Service: 10/05/2020  Anthony Melton  [id number]  DOB: [birth year]   DOA: 09/08/2020  Referring Physician: Carron Curie, MD  HPI: Anthony Melton is a 75 y.o. male seen for follow up of Acute on Chronic Respiratory Failure.  Patient is capping on room air moderate degree of secretions noted  Medications: Reviewed on Rounds  Physical Exam:  Vitals: Temperature 99.1 pulse 68 respiratory 21 blood pressure is 110/57 saturations 99%  Ventilator Settings capping at this time on room  . General: Comfortable at this time . Eyes: Grossly normal lids, irises & conjunctiva . ENT: grossly tongue is normal . Neck: no obvious mass . Cardiovascular: S1 S2 normal no gallop . Respiratory: No rhonchi rales are noted at this time . Abdomen: soft . Skin: no rash seen on limited exam . Musculoskeletal: not rigid . Psychiatric:unable to assess . Neurologic: no seizure no involuntary movements         Lab Data:   Basic Metabolic Panel: Recent Labs  Lab 10/02/20 0547 10/04/20 0348  NA 147* 148*  K 2.8* 3.4*  CL 108 109  CO2 31 31  GLUCOSE 109* 98  BUN 28* 24*  CREATININE 0.75 0.65  CALCIUM 8.6* 8.8*  MG 2.2  --     ABG: No results for input(s): PHART, PCO2ART, PO2ART, HCO3, O2SAT in the last 168 hours.  Liver Function Tests: No results for input(s): AST, ALT, ALKPHOS, BILITOT, PROT, ALBUMIN in the last 168 hours. No results for input(s): LIPASE, AMYLASE in the last 168 hours. No results for input(s): AMMONIA in the last 168 hours.  CBC: Recent Labs  Lab 10/02/20 0547 10/03/20 0701  WBC 9.4 9.5  HGB 8.7* 8.8*  HCT 28.3* 27.3*  MCV 75.7* 74.4*  PLT 281 296    Cardiac Enzymes: No results for input(s): CKTOTAL, CKMB, CKMBINDEX, TROPONINI in the last 168 hours.  BNP (last 3 results) No results for input(s): BNP in the last 8760  hours.  ProBNP (last 3 results) No results for input(s): PROBNP in the last 8760 hours.  Radiological Exams: No results found.  Assessment/Plan Active Problems:   Acute on chronic respiratory failure with hypoxia (HCC)   Severe sepsis (HCC)   Quadriplegia, C1-C4 complete (HCC)   Tracheostomy status (HCC)   1. Acute on chronic respiratory failure with hypoxia we will continue with capping patient has moderate secretions. 2. Severe sepsis treated improving 3. C1-4 quadriplegia no change 4. Tracheostomy remains in place at this time we will continue to follow   I have personally seen and evaluated the patient, evaluated laboratory and imaging results, formulated the assessment and plan and placed orders. The Patient requires high complexity decision making with multiple systems involvement.  Rounds were done with the Respiratory Therapy Director and Staff therapists and discussed with nursing staff also.  Yevonne Pax, MD [facility] Pulmonary Critical Care Medicine Sleep Medicine

## 2020-10-06 DIAGNOSIS — Z930 Tracheostomy status: Secondary | ICD-10-CM | POA: Diagnosis not present

## 2020-10-06 DIAGNOSIS — J9621 Acute and chronic respiratory failure with hypoxia: Secondary | ICD-10-CM | POA: Diagnosis not present

## 2020-10-06 DIAGNOSIS — G8251 Quadriplegia, C1-C4 complete: Secondary | ICD-10-CM | POA: Diagnosis not present

## 2020-10-06 DIAGNOSIS — A419 Sepsis, unspecified organism: Secondary | ICD-10-CM | POA: Diagnosis not present

## 2020-10-06 LAB — BASIC METABOLIC PANEL
Anion gap: 7 (ref 5–15)
BUN: 39 mg/dL — ABNORMAL HIGH (ref 8–23)
CO2: 30 mmol/L (ref 22–32)
Calcium: 8.1 mg/dL — ABNORMAL LOW (ref 8.9–10.3)
Chloride: 111 mmol/L (ref 98–111)
Creatinine, Ser: 0.98 mg/dL (ref 0.61–1.24)
GFR, Estimated: >60 mL/min (ref >60)
Glucose, Bld: 123 mg/dL — ABNORMAL HIGH (ref 70–99)
Potassium: 3 mmol/L — ABNORMAL LOW (ref 3.5–5.1)
Sodium: 148 mmol/L — ABNORMAL HIGH (ref 135–145)

## 2020-10-06 NOTE — Progress Notes (Signed)
Pulmonary Critical Care Medicine [facility] [place]   PULMONARY CRITICAL CARE SERVICE  PROGRESS NOTE  Date of Service: 10/06/2020  Anthony Melton  KZS:[id number]  DOB: 1946/01/06   DOA: 09/08/2020  Referring Physician: Carron Curie, MD  HPI: Anthony Melton is a 75 y.o. male seen for follow up of Acute on Chronic Respiratory Failure.  Patient currently is capping his been on room air no distress is noted  Medications: Reviewed on Rounds  Physical Exam:  Vitals: Temperature 99.3 pulse 101 respiratory rate is 20 blood pressure is 100/62 saturations 98%  Ventilator Settings capping off the vent  . General: Comfortable at this time . Eyes: Grossly normal lids, irises & conjunctiva . ENT: grossly tongue is normal . Neck: no obvious mass . Cardiovascular: S1 S2 normal no gallop . Respiratory: No rhonchi no rales noted at this time . Abdomen: soft . Skin: no rash seen on limited exam . Musculoskeletal: not rigid . Psychiatric:unable to assess . Neurologic: no seizure no involuntary movements         Lab Data:   Basic Metabolic Panel: Recent Labs  Lab 10/02/20 0547 10/04/20 0348 10/06/20 0329  NA 147* 148* 148*  K 2.8* 3.4* 3.0*  CL 108 109 111  CO2 31 31 30   GLUCOSE 109* 98 123*  BUN 28* 24* 39*  CREATININE 0.75 0.65 0.98  CALCIUM 8.6* 8.8* 8.1*  MG 2.2  --   --     ABG: No results for input(s): PHART, PCO2ART, PO2ART, HCO3, O2SAT in the last 168 hours.  Liver Function Tests: No results for input(s): AST, ALT, ALKPHOS, BILITOT, PROT, ALBUMIN in the last 168 hours. No results for input(s): LIPASE, AMYLASE in the last 168 hours. No results for input(s): AMMONIA in the last 168 hours.  CBC: Recent Labs  Lab 10/02/20 0547 10/03/20 0701  WBC 9.4 9.5  HGB 8.7* 8.8*  HCT 28.3* 27.3*  MCV 75.7* 74.4*  PLT 281 296    Cardiac Enzymes: No results for input(s): CKTOTAL, CKMB, CKMBINDEX, TROPONINI in the last 168 hours.  BNP (last 3  results) No results for input(s): BNP in the last 8760 hours.  ProBNP (last 3 results) No results for input(s): PROBNP in the last 8760 hours.  Radiological Exams: DG CHEST PORT 1 VIEW  Result Date: 10/05/2020 CLINICAL DATA:  Respiratory failure. EXAM: PORTABLE CHEST 1 VIEW COMPARISON:  09/15/2020 FINDINGS: The tracheostomy tube is stable. The left-sided PICC line is stable. The cardiac silhouette, mediastinal and hilar contours are unchanged. Persistent streaky bibasilar opacity, likely scarring change. No new infiltrates, pulmonary edema, pleural effusions or pneumothorax. IMPRESSION: 1. Stable support apparatus. 2. Persistent bibasilar scarring changes. No acute overlying pulmonary findings. Electronically Signed   By: 09/17/2020 M.D.   On: 10/05/2020 15:32    Assessment/Plan Active Problems:   Acute on chronic respiratory failure with hypoxia (HCC)   Severe sepsis (HCC)   Quadriplegia, C1-C4 complete (HCC)   Tracheostomy status (HCC)   1. Acute on chronic respiratory failure hypoxia we will continue with capping trials patient secretions are still limiting factor 2. Severe sepsis treated supportive care 3. Quadriplegia no change 4. Tracheostomy will remain in place   I have personally seen and evaluated the patient, evaluated laboratory and imaging results, formulated the assessment and plan and placed orders. The Patient requires high complexity decision making with multiple systems involvement.  Rounds were done with the Respiratory Therapy Director and Staff therapists and discussed with nursing staff also.  Saadat  Richardson Dopp, MD [facility] Pulmonary Critical Care Medicine Sleep Medicine

## 2020-10-07 LAB — POTASSIUM: Potassium: 3.1 mmol/L — ABNORMAL LOW (ref 3.5–5.1)

## 2020-10-08 DIAGNOSIS — A419 Sepsis, unspecified organism: Secondary | ICD-10-CM | POA: Diagnosis not present

## 2020-10-08 DIAGNOSIS — G8251 Quadriplegia, C1-C4 complete: Secondary | ICD-10-CM | POA: Diagnosis not present

## 2020-10-08 DIAGNOSIS — Z930 Tracheostomy status: Secondary | ICD-10-CM | POA: Diagnosis not present

## 2020-10-08 DIAGNOSIS — J9621 Acute and chronic respiratory failure with hypoxia: Secondary | ICD-10-CM | POA: Diagnosis not present

## 2020-10-08 LAB — BASIC METABOLIC PANEL
Anion gap: 6 (ref 5–15)
BUN: 31 mg/dL — ABNORMAL HIGH (ref 8–23)
CO2: 30 mmol/L (ref 22–32)
Calcium: 8.2 mg/dL — ABNORMAL LOW (ref 8.9–10.3)
Chloride: 115 mmol/L — ABNORMAL HIGH (ref 98–111)
Creatinine, Ser: 0.71 mg/dL (ref 0.61–1.24)
GFR, Estimated: >60 mL/min (ref >60)
Glucose, Bld: 122 mg/dL — ABNORMAL HIGH (ref 70–99)
Potassium: 3.2 mmol/L — ABNORMAL LOW (ref 3.5–5.1)
Sodium: 151 mmol/L — ABNORMAL HIGH (ref 135–145)

## 2020-10-08 LAB — CBC
HCT: 28.4 % — ABNORMAL LOW (ref 39.0–52.0)
Hemoglobin: 9.1 g/dL — ABNORMAL LOW (ref 13.0–17.0)
MCH: 23.8 pg — ABNORMAL LOW (ref 26.0–34.0)
MCHC: 32 g/dL (ref 30.0–36.0)
MCV: 74.2 fL — ABNORMAL LOW (ref 80.0–100.0)
Platelets: 320 10*3/uL (ref 150–400)
RBC: 3.83 MIL/uL — ABNORMAL LOW (ref 4.22–5.81)
RDW: 19.9 % — ABNORMAL HIGH (ref 11.5–15.5)
WBC: 13.7 10*3/uL — ABNORMAL HIGH (ref 4.0–10.5)
nRBC: 0.4 % — ABNORMAL HIGH (ref 0.0–0.2)

## 2020-10-08 NOTE — Progress Notes (Signed)
Pulmonary Critical Care Medicine [facility] [place]   PULMONARY CRITICAL CARE SERVICE  PROGRESS NOTE  Date of Service: 10/08/2020  Chen Saadeh  CZY:[id number]  DOB: [birth year]   DOA: 09/08/2020  Referring Physician: Carron Curie, MD  HPI: Anthony Melton is a 75 y.o. male seen for follow up of Acute on Chronic Respiratory Failure.  Patient is capping remains on room air appears to be tolerating it fairly well  Medications: Reviewed on Rounds  Physical Exam:  Vitals: Temperature is 97.2 pulse 97 respiratory rate is 22 blood pressure is 90/54 saturations 97%  Ventilator Settings capping on room air  . General: Comfortable at this time . Eyes: Grossly normal lids, irises & conjunctiva . ENT: grossly tongue is normal . Neck: no obvious mass . Cardiovascular: S1 S2 normal no gallop . Respiratory: No rhonchi very coarse breath sounds . Abdomen: soft . Skin: no rash seen on limited exam . Musculoskeletal: not rigid . Psychiatric:unable to assess . Neurologic: no seizure no involuntary movements         Lab Data:   Basic Metabolic Panel: Recent Labs  Lab 10/02/20 0547 10/04/20 0348 10/06/20 0329 10/07/20 0808 10/08/20 0514  NA 147* 148* 148*  --  151*  K 2.8* 3.4* 3.0* 3.1* 3.2*  CL 108 109 111  --  115*  CO2 31 31 30   --  30  GLUCOSE 109* 98 123*  --  122*  BUN 28* 24* 39*  --  31*  CREATININE 0.75 0.65 0.98  --  0.71  CALCIUM 8.6* 8.8* 8.1*  --  8.2*  MG 2.2  --   --   --   --     ABG: No results for input(s): PHART, PCO2ART, PO2ART, HCO3, O2SAT in the last 168 hours.  Liver Function Tests: No results for input(s): AST, ALT, ALKPHOS, BILITOT, PROT, ALBUMIN in the last 168 hours. No results for input(s): LIPASE, AMYLASE in the last 168 hours. No results for input(s): AMMONIA in the last 168 hours.  CBC: Recent Labs  Lab 10/02/20 0547 10/03/20 0701 10/08/20 0514  WBC 9.4 9.5 13.7*  HGB 8.7* 8.8* 9.1*  HCT 28.3* 27.3* 28.4*  MCV  75.7* 74.4* 74.2*  PLT 281 296 320    Cardiac Enzymes: No results for input(s): CKTOTAL, CKMB, CKMBINDEX, TROPONINI in the last 168 hours.  BNP (last 3 results) No results for input(s): BNP in the last 8760 hours.  ProBNP (last 3 results) No results for input(s): PROBNP in the last 8760 hours.  Radiological Exams: No results found.  Assessment/Plan Active Problems:   Acute on chronic respiratory failure with hypoxia (HCC)   Severe sepsis (HCC)   Quadriplegia, C1-C4 complete (HCC)   Tracheostomy status (HCC)   1. Acute on chronic respiratory failure with hypoxia plan is going to be to continue with the capping trial secretions are the limiting factor at this time for weaning further. 2. Quadriplegia no change we will continue with supportive care. 3. Tracheostomy remains in place we will continue with supportive care 4. Severe sepsis resolved   I have personally seen and evaluated the patient, evaluated laboratory and imaging results, formulated the assessment and plan and placed orders. The Patient requires high complexity decision making with multiple systems involvement.  Rounds were done with the Respiratory Therapy Director and Staff therapists and discussed with nursing staff also.  10/10/20, MD [facility] Pulmonary Critical Care Medicine Sleep Medicine

## 2020-10-09 DIAGNOSIS — J9621 Acute and chronic respiratory failure with hypoxia: Secondary | ICD-10-CM | POA: Diagnosis not present

## 2020-10-09 DIAGNOSIS — A419 Sepsis, unspecified organism: Secondary | ICD-10-CM | POA: Diagnosis not present

## 2020-10-09 DIAGNOSIS — G8251 Quadriplegia, C1-C4 complete: Secondary | ICD-10-CM | POA: Diagnosis not present

## 2020-10-09 DIAGNOSIS — Z930 Tracheostomy status: Secondary | ICD-10-CM | POA: Diagnosis not present

## 2020-10-09 LAB — BASIC METABOLIC PANEL
Anion gap: 5 (ref 5–15)
BUN: 25 mg/dL — ABNORMAL HIGH (ref 8–23)
CO2: 30 mmol/L (ref 22–32)
Calcium: 8.2 mg/dL — ABNORMAL LOW (ref 8.9–10.3)
Chloride: 113 mmol/L — ABNORMAL HIGH (ref 98–111)
Creatinine, Ser: 0.67 mg/dL (ref 0.61–1.24)
GFR, Estimated: >60 mL/min (ref >60)
Glucose, Bld: 142 mg/dL — ABNORMAL HIGH (ref 70–99)
Potassium: 3.1 mmol/L — ABNORMAL LOW (ref 3.5–5.1)
Sodium: 148 mmol/L — ABNORMAL HIGH (ref 135–145)

## 2020-10-09 LAB — CULTURE, RESPIRATORY W GRAM STAIN

## 2020-10-09 NOTE — Progress Notes (Signed)
Pulmonary Critical Care Medicine [facility] [place]   PULMONARY CRITICAL CARE SERVICE  PROGRESS NOTE  Date of Service: 10/09/2020  Anthony Melton  [id number]  DOB: 09-11-1945   DOA: 09/08/2020  Referring Physician: Carron Curie, MD  HPI: Anthony Melton.  Patient is capping currently on room air  Medications: Reviewed on Rounds  Physical Exam:  Vitals: Temperature is 97 pulse 99 respiratory 24 blood pressure is 75 year old 53 saturations 99%  Ventilator Settings capping off the ventilator  . General: Comfortable at this time . Eyes: Grossly normal lids, irises & conjunctiva . ENT: grossly tongue is normal . Neck: no obvious mass . Cardiovascular: S1 S2 normal no gallop . Respiratory: No rhonchi very coarse breath sound . Abdomen: soft . Skin: no rash seen on limited exam . Musculoskeletal: not rigid . Psychiatric:unable to assess . Neurologic: no seizure no involuntary movements         Lab Data:   Basic Metabolic Panel: Recent Labs  Lab 10/04/20 0348 10/06/20 0329 10/07/20 0808 10/08/20 0514 10/09/20 0434  NA 148* 148*  --  151* 148*  K 3.4* 3.0* 3.1* 3.2* 3.1*  CL 109 111  --  115* 113*  CO2 31 30  --  30 30  GLUCOSE 98 123*  --  122* 142*  BUN 24* 39*  --  31* 25*  CREATININE 0.65 0.98  --  0.71 0.67  CALCIUM 8.8* 8.1*  --  8.2* 8.2*    ABG: No results for input(s): PHART, PCO2ART, PO2ART, HCO3, O2SAT in the last 168 hours.  Liver Function Tests: No results for input(s): AST, ALT, ALKPHOS, BILITOT, PROT, ALBUMIN in the last 168 hours. No results for input(s): LIPASE, AMYLASE in the last 168 hours. No results for input(s): AMMONIA in the last 168 hours.  CBC: Recent Labs  Lab 10/03/20 0701 10/08/20 0514  WBC 9.5 13.7*  HGB 8.8* 9.1*  HCT 27.3* 28.4*  MCV 74.4* 74.2*  PLT 296 320    Cardiac Enzymes: No results for input(s): CKTOTAL, CKMB,  CKMBINDEX, TROPONINI in the last 168 hours.  BNP (last 3 results) No results for input(s): BNP in the last 8760 hours.  ProBNP (last 3 results) No results for input(s): PROBNP in the last 8760 hours.  Radiological Exams: No results found.  Assessment/Plan Active Problems:   Acute on chronic respiratory Melton with hypoxia (HCC)   Severe sepsis (HCC)   Quadriplegia, C1-C4 complete (HCC)   Tracheostomy status (HCC)   1. Acute on chronic respiratory Melton hypoxia we will continue with capping trials patient right now is on room air without any distress. 2. Severe sepsis treated resolved 3. Quadriplegia no change continue with present management 4. Tracheostomy will remain in place   I have personally seen and evaluated the patient, evaluated laboratory and imaging results, formulated the assessment and plan and placed orders. The Patient requires high complexity decision making with multiple systems involvement.  Rounds were done with the Respiratory Therapy Director and Staff therapists and discussed with nursing staff also.  Yevonne Pax, MD [facility] Pulmonary Critical Care Medicine Sleep Medicine

## 2020-10-10 DIAGNOSIS — A419 Sepsis, unspecified organism: Secondary | ICD-10-CM | POA: Diagnosis not present

## 2020-10-10 DIAGNOSIS — J9621 Acute and chronic respiratory failure with hypoxia: Secondary | ICD-10-CM | POA: Diagnosis not present

## 2020-10-10 DIAGNOSIS — Z930 Tracheostomy status: Secondary | ICD-10-CM | POA: Diagnosis not present

## 2020-10-10 DIAGNOSIS — G8251 Quadriplegia, C1-C4 complete: Secondary | ICD-10-CM | POA: Diagnosis not present

## 2020-10-10 LAB — POTASSIUM: Potassium: 3.4 mmol/L — ABNORMAL LOW (ref 3.5–5.1)

## 2020-10-10 NOTE — Progress Notes (Signed)
Pulmonary Critical Care Medicine [facility] [place]   PULMONARY CRITICAL CARE SERVICE  PROGRESS NOTE  Date of Service: 10/10/2020  Anthony Melton  [id number]  DOB: 03-Jul-1946   DOA: 09/08/2020  Referring Physician: Carron Curie, MD  HPI: Anthony Melton is a 75 y.o. male seen for follow up of Acute on Chronic Respiratory Failure.  Patient is currently He has been on room air secretions minimal but has for cough being poor needs suctioning  Medications: Reviewed on Rounds  Physical Exam:  Vitals: Temperature 98.3 pulse 83 respiratory rate is 22 blood pressure 132/66  Ventilator Settings capping of the ventilator  . General: Comfortable at this time . Eyes: Grossly normal lids, irises & conjunctiva . ENT: grossly tongue is normal . Neck: no obvious mass . Cardiovascular: S1 S2 normal no gallop . Respiratory: No rhonchi no rales are noted at this time . Abdomen: soft . Skin: no rash seen on limited exam . Musculoskeletal: not rigid . Psychiatric:unable to assess . Neurologic: no seizure no involuntary movements         Lab Data:   Basic Metabolic Panel: Recent Labs  Lab 10/04/20 0348 10/06/20 0329 10/07/20 0808 10/08/20 0514 10/09/20 0434 10/10/20 0730  NA 148* 148*  --  151* 148*  --   K 3.4* 3.0* 3.1* 3.2* 3.1* 3.4*  CL 109 111  --  115* 113*  --   CO2 31 30  --  30 30  --   GLUCOSE 98 123*  --  122* 142*  --   BUN 24* 39*  --  31* 25*  --   CREATININE 0.65 0.98  --  0.71 0.67  --   CALCIUM 8.8* 8.1*  --  8.2* 8.2*  --     ABG: No results for input(s): PHART, PCO2ART, PO2ART, HCO3, O2SAT in the last 168 hours.  Liver Function Tests: No results for input(s): AST, ALT, ALKPHOS, BILITOT, PROT, ALBUMIN in the last 168 hours. No results for input(s): LIPASE, AMYLASE in the last 168 hours. No results for input(s): AMMONIA in the last 168 hours.  CBC: Recent Labs  Lab 10/08/20 0514  WBC 13.7*  HGB 9.1*  HCT 28.4*  MCV 74.2*  PLT  320    Cardiac Enzymes: No results for input(s): CKTOTAL, CKMB, CKMBINDEX, TROPONINI in the last 168 hours.  BNP (last 3 results) No results for input(s): BNP in the last 8760 hours.  ProBNP (last 3 results) No results for input(s): PROBNP in the last 8760 hours.  Radiological Exams: No results found.  Assessment/Plan Active Problems:   Acute on chronic respiratory failure with hypoxia (HCC)   Severe sepsis (HCC)   Quadriplegia, C1-C4 complete (HCC)   Tracheostomy status (HCC)   1. Acute on chronic respiratory failure paroxysmal continue with capping hold off on decannulation for now 2. Severe sepsis treated resolving 3. Quadriplegia no change 4. Tracheostomy will remain in place   I have personally seen and evaluated the patient, evaluated laboratory and imaging results, formulated the assessment and plan and placed orders. The Patient requires high complexity decision making with multiple systems involvement.  Rounds were done with the Respiratory Therapy Director and Staff therapists and discussed with nursing staff also.  Yevonne Pax, MD [facility] Pulmonary Critical Care Medicine Sleep Medicine

## 2020-10-11 DIAGNOSIS — Z930 Tracheostomy status: Secondary | ICD-10-CM | POA: Diagnosis not present

## 2020-10-11 DIAGNOSIS — A419 Sepsis, unspecified organism: Secondary | ICD-10-CM | POA: Diagnosis not present

## 2020-10-11 DIAGNOSIS — J9621 Acute and chronic respiratory failure with hypoxia: Secondary | ICD-10-CM | POA: Diagnosis not present

## 2020-10-11 DIAGNOSIS — G8251 Quadriplegia, C1-C4 complete: Secondary | ICD-10-CM | POA: Diagnosis not present

## 2020-10-11 LAB — BASIC METABOLIC PANEL
Anion gap: 7 (ref 5–15)
BUN: 18 mg/dL (ref 8–23)
CO2: 25 mmol/L (ref 22–32)
Calcium: 8.2 mg/dL — ABNORMAL LOW (ref 8.9–10.3)
Chloride: 107 mmol/L (ref 98–111)
Creatinine, Ser: 0.59 mg/dL — ABNORMAL LOW (ref 0.61–1.24)
GFR, Estimated: >60 mL/min (ref >60)
Glucose, Bld: 104 mg/dL — ABNORMAL HIGH (ref 70–99)
Potassium: 3.7 mmol/L (ref 3.5–5.1)
Sodium: 139 mmol/L (ref 135–145)

## 2020-10-11 LAB — CBC
HCT: 26 % — ABNORMAL LOW (ref 39.0–52.0)
Hemoglobin: 8.3 g/dL — ABNORMAL LOW (ref 13.0–17.0)
MCH: 23.6 pg — ABNORMAL LOW (ref 26.0–34.0)
MCHC: 31.9 g/dL (ref 30.0–36.0)
MCV: 73.9 fL — ABNORMAL LOW (ref 80.0–100.0)
Platelets: 339 10*3/uL (ref 150–400)
RBC: 3.52 MIL/uL — ABNORMAL LOW (ref 4.22–5.81)
RDW: 19.9 % — ABNORMAL HIGH (ref 11.5–15.5)
WBC: 7.4 10*3/uL (ref 4.0–10.5)
nRBC: 0.7 % — ABNORMAL HIGH (ref 0.0–0.2)

## 2020-10-11 LAB — MAGNESIUM: Magnesium: 2.1 mg/dL (ref 1.7–2.4)

## 2020-10-11 NOTE — Progress Notes (Signed)
Pulmonary Critical Care Medicine [facility] [place]   PULMONARY CRITICAL CARE SERVICE  PROGRESS NOTE  Date of Service: 10/11/2020  Anthony Melton  [id number]  DOB: 1945-09-27   DOA: 09/08/2020  Referring Physician: Carron Curie, MD  HPI: Anthony Melton is a 75 y.o. male seen for follow up of Acute on Chronic Respiratory Failure.  Patient remains capping on room air good saturations are noted.  No distress  Medications: Reviewed on Rounds  Physical Exam:  Vitals: Temperature is 97.8 pulse 90 respiratory rate is 24 blood pressure is 138/78 saturations 99%  Ventilator Settings capping of the ventilator  . General: Comfortable at this time . Eyes: Grossly normal lids, irises & conjunctiva . ENT: grossly tongue is normal . Neck: no obvious mass . Cardiovascular: S1 S2 normal no gallop . Respiratory: No rhonchi very coarse breath sounds are noted at this time . Abdomen: soft . Skin: no rash seen on limited exam . Musculoskeletal: not rigid . Psychiatric:unable to assess . Neurologic: no seizure no involuntary movements         Lab Data:   Basic Metabolic Panel: Recent Labs  Lab 10/06/20 0329 10/07/20 0808 10/08/20 0514 10/09/20 0434 10/10/20 0730 10/11/20 0415  NA 148*  --  151* 148*  --  139  K 3.0* 3.1* 3.2* 3.1* 3.4* 3.7  CL 111  --  115* 113*  --  107  CO2 30  --  30 30  --  25  GLUCOSE 123*  --  122* 142*  --  104*  BUN 39*  --  31* 25*  --  18  CREATININE 0.98  --  0.71 0.67  --  0.59*  CALCIUM 8.1*  --  8.2* 8.2*  --  8.2*  MG  --   --   --   --   --  2.1    ABG: No results for input(s): PHART, PCO2ART, PO2ART, HCO3, O2SAT in the last 168 hours.  Liver Function Tests: No results for input(s): AST, ALT, ALKPHOS, BILITOT, PROT, ALBUMIN in the last 168 hours. No results for input(s): LIPASE, AMYLASE in the last 168 hours. No results for input(s): AMMONIA in the last 168 hours.  CBC: Recent Labs  Lab 10/08/20 0514 10/11/20 0415   WBC 13.7* 7.4  HGB 9.1* 8.3*  HCT 28.4* 26.0*  MCV 74.2* 73.9*  PLT 320 339    Cardiac Enzymes: No results for input(s): CKTOTAL, CKMB, CKMBINDEX, TROPONINI in the last 168 hours.  BNP (last 3 results) No results for input(s): BNP in the last 8760 hours.  ProBNP (last 3 results) No results for input(s): PROBNP in the last 8760 hours.  Radiological Exams: No results found.  Assessment/Plan Active Problems:   Acute on chronic respiratory failure with hypoxia (HCC)   Severe sepsis (HCC)   Quadriplegia, C1-C4 complete (HCC)   Tracheostomy status (HCC)   1. Acute on chronic respiratory failure hypoxia plan is to continue with capping trials titrate oxygen continue secretion management supportive care 2. Severe sepsis treated resolving 3. Quadriplegia no change 4. Tracheostomy will remain in place   I have personally seen and evaluated the patient, evaluated laboratory and imaging results, formulated the assessment and plan and placed orders. The Patient requires high complexity decision making with multiple systems involvement.  Rounds were done with the Respiratory Therapy Director and Staff therapists and discussed with nursing staff also.  Yevonne Pax, MD [facility] Pulmonary Critical Care Medicine Sleep Medicine

## 2020-10-12 DIAGNOSIS — A419 Sepsis, unspecified organism: Secondary | ICD-10-CM | POA: Diagnosis not present

## 2020-10-12 DIAGNOSIS — J9621 Acute and chronic respiratory failure with hypoxia: Secondary | ICD-10-CM | POA: Diagnosis not present

## 2020-10-12 DIAGNOSIS — G8251 Quadriplegia, C1-C4 complete: Secondary | ICD-10-CM | POA: Diagnosis not present

## 2020-10-12 DIAGNOSIS — Z930 Tracheostomy status: Secondary | ICD-10-CM | POA: Diagnosis not present

## 2020-10-12 NOTE — Progress Notes (Signed)
Pulmonary Critical Care Medicine [facility] [place]   PULMONARY CRITICAL CARE SERVICE  PROGRESS NOTE  Date of Service: 10/12/2020  Anthony Melton  [id number]:[id number]  DOB: [birth year]   DOA: 09/08/2020  Referring Physician: Carron Curie, MD  HPI: Anthony Melton is a 75 y.o. male seen for follow up of Acute on Chronic Respiratory Failure.  Patient is comfortable right now still capping weak cough  Medications: Reviewed on Rounds  Physical Exam:  Vitals: Temperature is 97.9 pulse 86 respiratory 24 blood pressure is 126/68 saturations 100%  Ventilator Settings capping on room air  . General: Comfortable at this time . Eyes: Grossly normal lids, irises & conjunctiva . ENT: grossly tongue is normal . Neck: no obvious mass . Cardiovascular: S1 S2 normal no gallop . Respiratory: No rhonchi very coarse breath sounds . Abdomen: soft . Skin: no rash seen on limited exam . Musculoskeletal: not rigid . Psychiatric:unable to assess . Neurologic: no seizure no involuntary movements         Lab Data:   Basic Metabolic Panel: Recent Labs  Lab 10/06/20 0329 10/07/20 0808 10/08/20 0514 10/09/20 0434 10/10/20 0730 10/11/20 0415  NA 148*  --  151* 148*  --  139  K 3.0* 3.1* 3.2* 3.1* 3.4* 3.7  CL 111  --  115* 113*  --  107  CO2 30  --  30 30  --  25  GLUCOSE 123*  --  122* 142*  --  104*  BUN 39*  --  31* 25*  --  18  CREATININE 0.98  --  0.71 0.67  --  0.59*  CALCIUM 8.1*  --  8.2* 8.2*  --  8.2*  MG  --   --   --   --   --  2.1    ABG: No results for input(s): PHART, PCO2ART, PO2ART, HCO3, O2SAT in the last 168 hours.  Liver Function Tests: No results for input(s): AST, ALT, ALKPHOS, BILITOT, PROT, ALBUMIN in the last 168 hours. No results for input(s): LIPASE, AMYLASE in the last 168 hours. No results for input(s): AMMONIA in the last 168 hours.  CBC: Recent Labs  Lab 10/08/20 0514 10/11/20 0415  WBC 13.7* 7.4  HGB 9.1* 8.3*  HCT 28.4* 26.0*  MCV  74.2* 73.9*  PLT 320 339    Cardiac Enzymes: No results for input(s): CKTOTAL, CKMB, CKMBINDEX, TROPONINI in the last 168 hours.  BNP (last 3 results) No results for input(s): BNP in the last 8760 hours.  ProBNP (last 3 results) No results for input(s): PROBNP in the last 8760 hours.  Radiological Exams: No results found.  Assessment/Plan Active Problems:   Acute on chronic respiratory failure with hypoxia (HCC)   Severe sepsis (HCC)   Quadriplegia, C1-C4 complete (HCC)   Tracheostomy status (HCC)   1. Acute on chronic respiratory failure with hypoxia plan is going to be to continue with capping trials patient still with weak cough we will continue to monitor 2. Severe sepsis treated in resolution 3. Quadriplegia no change 4. Tracheostomy remains in place at this time.   I have personally seen and evaluated the patient, evaluated laboratory and imaging results, formulated the assessment and plan and placed orders. The Patient requires high complexity decision making with multiple systems involvement.  Rounds were done with the Respiratory Therapy Director and Staff therapists and discussed with nursing staff also.  Yevonne Pax, MD [facility] Pulmonary Critical Care Medicine Sleep Medicine

## 2020-10-13 DIAGNOSIS — A419 Sepsis, unspecified organism: Secondary | ICD-10-CM | POA: Diagnosis not present

## 2020-10-13 DIAGNOSIS — G8251 Quadriplegia, C1-C4 complete: Secondary | ICD-10-CM | POA: Diagnosis not present

## 2020-10-13 DIAGNOSIS — Z930 Tracheostomy status: Secondary | ICD-10-CM | POA: Diagnosis not present

## 2020-10-13 DIAGNOSIS — J9621 Acute and chronic respiratory failure with hypoxia: Secondary | ICD-10-CM | POA: Diagnosis not present

## 2020-10-13 NOTE — Progress Notes (Signed)
Pulmonary Critical Care Medicine [facility] [place]   PULMONARY CRITICAL CARE SERVICE  PROGRESS NOTE  Date of Service: 10/13/2020  Anthony Melton  [id number]:[id number]  DOB: 12/23/1945   DOA: 09/08/2020  Referring Physician: Carron Curie, MD  HPI: Anthony Melton is a 75 y.o. male seen for follow up of Acute on Chronic Respiratory Failure.  Patient is currently capping his been on room air without any distress at this time.  Medications: Reviewed on Rounds  Physical Exam:  Vitals: Temperature is 98.4 pulse 98 respiratory 18 blood pressure is 142/74 saturations 98%  Ventilator Settings capping on room air  . General: Comfortable at this time . Eyes: Grossly normal lids, irises & conjunctiva . ENT: grossly tongue is normal . Neck: no obvious mass . Cardiovascular: S1 S2 normal no gallop . Respiratory: No rhonchi no rales noted at this time . Abdomen: soft . Skin: no rash seen on limited exam . Musculoskeletal: not rigid . Psychiatric:unable to assess . Neurologic: no seizure no involuntary movements         Lab Data:   Basic Metabolic Panel: Recent Labs  Lab 10/07/20 0808 10/08/20 0514 10/09/20 0434 10/10/20 0730 10/11/20 0415  NA  --  151* 148*  --  139  K 3.1* 3.2* 3.1* 3.4* 3.7  CL  --  115* 113*  --  107  CO2  --  30 30  --  25  GLUCOSE  --  122* 142*  --  104*  BUN  --  31* 25*  --  18  CREATININE  --  0.71 0.67  --  0.59*  CALCIUM  --  8.2* 8.2*  --  8.2*  MG  --   --   --   --  2.1    ABG: No results for input(s): PHART, PCO2ART, PO2ART, HCO3, O2SAT in the last 168 hours.  Liver Function Tests: No results for input(s): AST, ALT, ALKPHOS, BILITOT, PROT, ALBUMIN in the last 168 hours. No results for input(s): LIPASE, AMYLASE in the last 168 hours. No results for input(s): AMMONIA in the last 168 hours.  CBC: Recent Labs  Lab 10/08/20 0514 10/11/20 0415  WBC 13.7* 7.4  HGB 9.1* 8.3*  HCT 28.4* 26.0*  MCV 74.2* 73.9*  PLT 320 339     Cardiac Enzymes: No results for input(s): CKTOTAL, CKMB, CKMBINDEX, TROPONINI in the last 168 hours.  BNP (last 3 results) No results for input(s): BNP in the last 8760 hours.  ProBNP (last 3 results) No results for input(s): PROBNP in the last 8760 hours.  Radiological Exams: No results found.  Assessment/Plan Active Problems:   Acute on chronic respiratory failure with hypoxia (HCC)   Severe sepsis (HCC)   Quadriplegia, C1-C4 complete (HCC)   Tracheostomy status (HCC)   1. Acute on chronic respiratory failure hypoxia we will continue with capping trials patient secretions are still an issue 2. Severe sepsis treated resolving slowly 3. Quadriplegia no change 4. Tracheostomy will need to remain in place right now   I have personally seen and evaluated the patient, evaluated laboratory and imaging results, formulated the assessment and plan and placed orders. The Patient requires high complexity decision making with multiple systems involvement.  Rounds were done with the Respiratory Therapy Director and Staff therapists and discussed with nursing staff also.  Yevonne Pax, MD [facility] Pulmonary Critical Care Medicine Sleep Medicine

## 2020-10-14 DIAGNOSIS — A419 Sepsis, unspecified organism: Secondary | ICD-10-CM | POA: Diagnosis not present

## 2020-10-14 DIAGNOSIS — J9621 Acute and chronic respiratory failure with hypoxia: Secondary | ICD-10-CM | POA: Diagnosis not present

## 2020-10-14 DIAGNOSIS — Z930 Tracheostomy status: Secondary | ICD-10-CM | POA: Diagnosis not present

## 2020-10-14 DIAGNOSIS — G8251 Quadriplegia, C1-C4 complete: Secondary | ICD-10-CM | POA: Diagnosis not present

## 2020-10-14 NOTE — Progress Notes (Signed)
Pulmonary Critical Care Medicine [facility] [place]   PULMONARY CRITICAL CARE SERVICE  PROGRESS NOTE  Date of Service: 10/14/2020  Anthony Melton  [id number]  DOB: 09-11-1945   DOA: 09/08/2020  Referring Physician: Carron Curie, MD  HPI: Anthony Melton is a 75 y.o. male seen for follow up of Acute on Chronic Respiratory Failure.  Patient is capping on room air secretions still remain an issue  Medications: Reviewed on Rounds  Physical Exam:  Vitals: Temperature is 96.4 pulse 78 respiratory rate 20 blood pressure is 124/68 saturations 100%  Ventilator Settings capping off the ventilator at this time  . General: Comfortable at this time . Eyes: Grossly normal lids, irises & conjunctiva . ENT: grossly tongue is normal . Neck: no obvious mass . Cardiovascular: S1 S2 normal no gallop . Respiratory: No rhonchi rales are noted at this time. . Abdomen: soft . Skin: no rash seen on limited exam . Musculoskeletal: not rigid . Psychiatric:unable to assess . Neurologic: no seizure no involuntary movements         Lab Data:   Basic Metabolic Panel: Recent Labs  Lab 10/08/20 0514 10/09/20 0434 10/10/20 0730 10/11/20 0415  NA 151* 148*  --  139  K 3.2* 3.1* 3.4* 3.7  CL 115* 113*  --  107  CO2 30 30  --  25  GLUCOSE 122* 142*  --  104*  BUN 31* 25*  --  18  CREATININE 0.71 0.67  --  0.59*  CALCIUM 8.2* 8.2*  --  8.2*  MG  --   --   --  2.1    ABG: No results for input(s): PHART, PCO2ART, PO2ART, HCO3, O2SAT in the last 168 hours.  Liver Function Tests: No results for input(s): AST, ALT, ALKPHOS, BILITOT, PROT, ALBUMIN in the last 168 hours. No results for input(s): LIPASE, AMYLASE in the last 168 hours. No results for input(s): AMMONIA in the last 168 hours.  CBC: Recent Labs  Lab 10/08/20 0514 10/11/20 0415  WBC 13.7* 7.4  HGB 9.1* 8.3*  HCT 28.4* 26.0*  MCV 74.2* 73.9*  PLT 320 339    Cardiac Enzymes: No results for input(s):  CKTOTAL, CKMB, CKMBINDEX, TROPONINI in the last 168 hours.  BNP (last 3 results) No results for input(s): BNP in the last 8760 hours.  ProBNP (last 3 results) No results for input(s): PROBNP in the last 8760 hours.  Radiological Exams: No results found.  Assessment/Plan Active Problems:   Acute on chronic respiratory failure with hypoxia (HCC)   Severe sepsis (HCC)   Quadriplegia, C1-C4 complete (HCC)   Tracheostomy status (HCC)   1. Acute on chronic respiratory failure hypoxia we will continue with capping trials titrate oxygen continue pulmonary toilet. 2. Severe sepsis resolved 3. Quadriplegia no change we will continue to follow 4. Tracheostomy remains in place at this time   I have personally seen and evaluated the patient, evaluated laboratory and imaging results, formulated the assessment and plan and placed orders. The Patient requires high complexity decision making with multiple systems involvement.  Rounds were done with the Respiratory Therapy Director and Staff therapists and discussed with nursing staff also.  Yevonne Pax, MD [facility] Pulmonary Critical Care Medicine Sleep Medicine

## 2020-10-15 DIAGNOSIS — J9621 Acute and chronic respiratory failure with hypoxia: Secondary | ICD-10-CM | POA: Diagnosis not present

## 2020-10-15 DIAGNOSIS — G8251 Quadriplegia, C1-C4 complete: Secondary | ICD-10-CM | POA: Diagnosis not present

## 2020-10-15 DIAGNOSIS — A419 Sepsis, unspecified organism: Secondary | ICD-10-CM | POA: Diagnosis not present

## 2020-10-15 DIAGNOSIS — Z930 Tracheostomy status: Secondary | ICD-10-CM | POA: Diagnosis not present

## 2020-10-16 DIAGNOSIS — G8251 Quadriplegia, C1-C4 complete: Secondary | ICD-10-CM | POA: Diagnosis not present

## 2020-10-16 DIAGNOSIS — A419 Sepsis, unspecified organism: Secondary | ICD-10-CM | POA: Diagnosis not present

## 2020-10-16 DIAGNOSIS — Z930 Tracheostomy status: Secondary | ICD-10-CM | POA: Diagnosis not present

## 2020-10-16 DIAGNOSIS — J9621 Acute and chronic respiratory failure with hypoxia: Secondary | ICD-10-CM | POA: Diagnosis not present

## 2020-10-16 NOTE — Progress Notes (Signed)
Pulmonary Critical Care Medicine [facility] [place]   PULMONARY CRITICAL CARE SERVICE  PROGRESS NOTE    Anthony Melton  [id number]  DOB: 07-Feb-1946   DOA: 09/08/2020  Referring Physician: Carron Curie, MD  HPI: Anthony Melton is a 75 y.o. male seen for follow up of Acute on Chronic Respiratory Failure.  Resting comfortably.  Respiratory therapy is reporting that the cough is becoming more effective and that he is able to bring up secretions to the mouth to his suction [place]  Medications: Reviewed on Rounds  Physical Exam:  Vitals: Temperature 97 pulse 85 respiratory rate is 20 blood pressure 141/73 saturations 99%  Ventilator Settings capping off the ventilator on room air  . General: Comfortable at this time . Eyes: Grossly normal lids, irises & conjunctiva . ENT: grossly tongue is normal . Neck: no obvious mass . Cardiovascular: S1 S2 normal no gallop . Respiratory: Scattered rhonchi expansion is equal at this time . Abdomen: soft . Skin: no rash seen on limited exam . Musculoskeletal: not rigid . Psychiatric:unable to assess . Neurologic: no seizure no involuntary movements         Lab Data:   Basic Metabolic Panel: Recent Labs  Lab 10/10/20 0730 10/11/20 0415  NA  --  139  K 3.4* 3.7  CL  --  107  CO2  --  25  GLUCOSE  --  104*  BUN  --  18  CREATININE  --  0.59*  CALCIUM  --  8.2*  MG  --  2.1    ABG: No results for input(s): PHART, PCO2ART, PO2ART, HCO3, O2SAT in the last 168 hours.  Liver Function Tests: No results for input(s): AST, ALT, ALKPHOS, BILITOT, PROT, ALBUMIN in the last 168 hours. No results for input(s): LIPASE, AMYLASE in the last 168 hours. No results for input(s): AMMONIA in the last 168 hours.  CBC: Recent Labs  Lab 10/11/20 0415  WBC 7.4  HGB 8.3*  HCT 26.0*  MCV 73.9*  PLT 339    Cardiac Enzymes: No results for input(s): CKTOTAL, CKMB, CKMBINDEX, TROPONINI in the last 168 hours.  BNP (last 3  results) No results for input(s): BNP in the last 8760 hours.  ProBNP (last 3 results) No results for input(s): PROBNP in the last 8760 hours.  Radiological Exams: No results found.  Assessment/Plan Active Problems:   Acute on chronic respiratory failure with hypoxia (HCC)   Severe sepsis (HCC)   Quadriplegia, C1-C4 complete (HCC)   Tracheostomy status (HCC)   1. Acute on chronic respiratory failure with hypoxia plan is going to be to continue with capping and move over to consider decannulation since the patient secretions are significantly better and he is able to handle them now 2. Severe sepsis resolved 3. Quadriplegia no change 4. Tracheostomy right now remains in place but has been doing well with capping trials for many days   I have personally seen and evaluated the patient, evaluated laboratory and imaging results, formulated the assessment and plan and placed orders. The Patient requires high complexity decision making with multiple systems involvement.  Rounds were done with the Respiratory Therapy Director and Staff therapists and discussed with nursing staff also.  Yevonne Pax, MD [facility] Pulmonary Critical Care Medicine Sleep Medicine

## 2020-10-16 NOTE — Progress Notes (Signed)
Pulmonary Critical Care Medicine [facility] [place]   PULMONARY CRITICAL CARE SERVICE  PROGRESS NOTE  Date of Service: 10/16/2020   Jessy Cybulski  [id number]:[id number]  DOB: 1946/06/15   DOA: 09/08/2020  Referring Physician: Carron Curie, MD  HPI: Herold Salguero is a 75 y.o. male seen for follow up of Acute on Chronic Respiratory Failure.  Patient right now is capping on room air doing fairly well  Medications: Reviewed on Rounds  Physical Exam:  Vitals: Temperature is 98.8 pulse 79 respiratory 18 blood pressure is 123/69 saturations 100%  Ventilator Settings capping on room air  . General: Comfortable at this time . Eyes: Grossly normal lids, irises & conjunctiva . ENT: grossly tongue is normal . Neck: no obvious mass . Cardiovascular: S1 S2 normal no gallop . Respiratory: No rhonchi very coarse breath sounds . Abdomen: soft . Skin: no rash seen on limited exam . Musculoskeletal: not rigid . Psychiatric:unable to assess . Neurologic: no seizure no involuntary movements         Lab Data:   Basic Metabolic Panel: Recent Labs  Lab 10/10/20 0730 10/11/20 0415  NA  --  139  K 3.4* 3.7  CL  --  107  CO2  --  25  GLUCOSE  --  104*  BUN  --  18  CREATININE  --  0.59*  CALCIUM  --  8.2*  MG  --  2.1    ABG: No results for input(s): PHART, PCO2ART, PO2ART, HCO3, O2SAT in the last 168 hours.  Liver Function Tests: No results for input(s): AST, ALT, ALKPHOS, BILITOT, PROT, ALBUMIN in the last 168 hours. No results for input(s): LIPASE, AMYLASE in the last 168 hours. No results for input(s): AMMONIA in the last 168 hours.  CBC: Recent Labs  Lab 10/11/20 0415  WBC 7.4  HGB 8.3*  HCT 26.0*  MCV 73.9*  PLT 339    Cardiac Enzymes: No results for input(s): CKTOTAL, CKMB, CKMBINDEX, TROPONINI in the last 168 hours.  BNP (last 3 results) No results for input(s): BNP in the last 8760 hours.  ProBNP (last 3 results) No results for input(s):  PROBNP in the last 8760 hours.  Radiological Exams: No results found.  Assessment/Plan Active Problems:   Acute on chronic respiratory failure with hypoxia (HCC)   Severe sepsis (HCC)   Quadriplegia, C1-C4 complete (HCC)   Tracheostomy status (HCC)   1. Acute on chronic respiratory failure hypoxia we will continue with capping secretions are significantly better.  Patient also has a good ability to cough up the secretions and so therefore I think we are working towards decannulation. 2. Severe sepsis treated resolving 3. Quadriplegia no change overall 4. Tracheostomy will consider possibility of removing the trach   I have personally seen and evaluated the patient, evaluated laboratory and imaging results, formulated the assessment and plan and placed orders. The Patient requires high complexity decision making with multiple systems involvement.  Rounds were done with the Respiratory Therapy Director and Staff therapists and discussed with nursing staff also.  Yevonne Pax, MD [facility] Pulmonary Critical Care Medicine Sleep Medicine

## 2020-10-17 DIAGNOSIS — Z930 Tracheostomy status: Secondary | ICD-10-CM | POA: Diagnosis not present

## 2020-10-17 DIAGNOSIS — J9621 Acute and chronic respiratory failure with hypoxia: Secondary | ICD-10-CM | POA: Diagnosis not present

## 2020-10-17 DIAGNOSIS — A419 Sepsis, unspecified organism: Secondary | ICD-10-CM | POA: Diagnosis not present

## 2020-10-17 DIAGNOSIS — G8251 Quadriplegia, C1-C4 complete: Secondary | ICD-10-CM | POA: Diagnosis not present

## 2020-10-17 NOTE — Progress Notes (Signed)
Pulmonary Critical Care Medicine [facility] [place]   PULMONARY CRITICAL CARE SERVICE  PROGRESS NOTE  Date of Service: 10/17/2020   Anthony Melton  [id number]  DOB: Apr 18, 1946   DOA: 09/08/2020  Referring Physician: Carron Curie, MD  HPI: Anthony Melton is a 75 y.o. male seen for follow up of Acute on Chronic Respiratory Failure.  Patient is capping trials has been on room air ready for decannulation.  Spoke with case management working on discharge planning only issue is his antibiotics right now which should be completed in the next week  Medications: Reviewed on Rounds  Physical Exam:  Vitals: Temperature is 96.9 pulse 74 respiratory 22 blood pressure 116/72 saturations 100%  Ventilator Settings capping on room air  . General: Comfortable at this time . Eyes: Grossly normal lids, irises & conjunctiva . ENT: grossly tongue is normal . Neck: no obvious mass . Cardiovascular: S1 S2 normal no gallop . Respiratory: No rhonchi coarse breath sounds . Abdomen: soft . Skin: no rash seen on limited exam . Musculoskeletal: not rigid . Psychiatric:unable to assess . Neurologic: no seizure no involuntary movements         Lab Data:   Basic Metabolic Panel: Recent Labs  Lab 10/11/20 0415  NA 139  K 3.7  CL 107  CO2 25  GLUCOSE 104*  BUN 18  CREATININE 0.59*  CALCIUM 8.2*  MG 2.1    ABG: No results for input(s): PHART, PCO2ART, PO2ART, HCO3, O2SAT in the last 168 hours.  Liver Function Tests: No results for input(s): AST, ALT, ALKPHOS, BILITOT, PROT, ALBUMIN in the last 168 hours. No results for input(s): LIPASE, AMYLASE in the last 168 hours. No results for input(s): AMMONIA in the last 168 hours.  CBC: Recent Labs  Lab 10/11/20 0415  WBC 7.4  HGB 8.3*  HCT 26.0*  MCV 73.9*  PLT 339    Cardiac Enzymes: No results for input(s): CKTOTAL, CKMB, CKMBINDEX, TROPONINI in the last 168 hours.  BNP (last 3 results) No results for  input(s): BNP in the last 8760 hours.  ProBNP (last 3 results) No results for input(s): PROBNP in the last 8760 hours.  Radiological Exams: No results found.  Assessment/Plan Active Problems:   Acute on chronic respiratory failure with hypoxia (HCC)   Severe sepsis (HCC)   Quadriplegia, C1-C4 complete (HCC)   Tracheostomy status (HCC)   1. Acute on chronic respiratory failure with hypoxia patient currently is capping has been on room air ready for decannulation. 2. Severe sepsis resolved 3. Quadriplegia no change his cough is more effective 4. Tracheostomy will be removed   I have personally seen and evaluated the patient, evaluated laboratory and imaging results, formulated the assessment and plan and placed orders. The Patient requires high complexity decision making with multiple systems involvement.  Rounds were done with the Respiratory Therapy Director and Staff therapists and discussed with nursing staff also.  Yevonne Pax, MD [facility] Pulmonary Critical Care Medicine Sleep Medicine

## 2020-10-19 NOTE — Progress Notes (Signed)
PROGRESS NOTE    Anthony Melton  ZTI:[id number] DOB: 30-Jun-1946 DOA: 09/08/2020   Brief Narrative:  Anthony Melton is an 75 y.o. male with medical history significant of hypertension, hyperlipidemia.  He apparently presented to [facility] on 08/07/2020 with generalized weakness, shortness of breath, upper respiratory tract symptoms for a week.  He was having difficulty walking, shuffling gait, intermittent backslash neck pain, right greater than left upper extremity weakness, inability to urinate.  He was transferred to [facility].  At the time of examination there he was found to have no sensation below the waist, arm weakness.  He was started on IV vancomycin, cefepime.  Blood cultures showed staph aureus.  Urine also showed staph aureus.  He did not have any reflexes.  Due to worsening shortness of breath he was intubated.  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  MRI showed large rim-enhancing epidural abscess extending from the superior endplate of C2 inferiorly to the endplate of T8 with mass-effect upon the thoracic spinal cord.  Abscess also appeared to extend into the left C6-C7 neural foraminal with enhancing soft tissue along the left lateral aspect of the thoracic spinal cord with no evidence of spinal abscess.  However, there was a 3.9 x 1 x 2.1 cm epidural abscess extending from the dens through the inferior endplate of C3 deviating the cord dorsally with severe spinal canal stenosis with a second small epidural abscess extending from the superior endplate of C4 through the inferior endplate of C5.  MRI of the head also showed pansinusitis.  Sputum cultures also had MSSA.  Neurosurgery was consulted.  He had laminectomy and washout of the cervical spine epidural abscess on 08/11/2020.  Thoracic laminectomy was not performed.  Intraoperative cultures showed MSSA.  CT soft tissue of the neck showed enhancing collection in the ventral  aspect of the spinal canal at C1-C3.  MRI of the C-spine showed residual enhancing ventral epidural abscess with moderate residual central canal stenosis, minimally decreased with phlegmon at C4-C5 as well as ventral epidural abscess from T3-T9.  His blood cultures remained positive for MSSA.  On 08/22/2020 patient had repeat I&D of the cervical/thoracic spine.  Operative cultures did not show any growth.  He had tracheostomy done on 08/26/2020.  Infectious disease was consulted for the persistent positive blood cultures and he was treated with a combination of daptomycin and oxacillin. He also has a suprapubic cath.  He initially was on pressors but later weaned off pressors.  TTE and TEE done on 08/13/2020 on 08/16/2020 did not show any vegetations.  He was treated with dual antibiotics due to persistent bacteremia. His cultures finally became negative on 08/27/2020.  Due to his complex medical problems he was transferred and admitted to [facility] on 09/08/2020.  He is unfortunately quadriplegic with severe weakness of his upper extremities and his lower extremities. He had a DVT of his right upper extremity and on Eliquis.  He had increased secretions with worsening leukocytosis.  Respiratory cultures collected on 10/06/2020 which showed abundant Pseudomonas aeruginosa, Acinetobacter Baumanni complex.  Therefore started on Zosyn.  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    He had respiratory cultures that showed Pseudomonas, Acinetobacter.  He was started on Zosyn.  He also remains on treatment with IV nafcillin.  Do not think he needs both antibiotics.  At this time suggest to hold the nafcillin while on the Zosyn and then restart after completion of the Zosyn to complete the treatment for his epidural abscess.  Please monitor CBC, BUN/cr closely while on antibiotics.  Pneumonia: He had increased secretions.  Respiratory cultures collected on 10/06/2020 showed Pseudomonas aeruginosa abundant, also Acinetobacter.  Started on Zosyn.  Plan today for tentative duration of 1 week pending improvement.  However, he also remains on the nafcillin.  The Zosyn should technically also cover for the MSSA.  Therefore recommend to hold the nafcillin while on the Zosyn and restart after the Zosyn to complete the treatment duration for his epidural abscess.  Sepsis with MSSA: He had persistent MSSA bacteremia at the acute facility.  He was also hypotensive and was on pressors.  He continued to be bacteremic therefore he needed dual antibiotic treatment with oxacillin, daptomycin. TTE 1/17 and TEE 1/20 did not show any vegetations. His blood cultures finally cleared on 08/27/2020.  He has been on the dual antibiotic treatment until 09/10/2020 after which he was switched to monotherapy with nafcillin.    Blood cultures from here collected on 09/30/2020 did not show any growth. Continue to monitor closely.  If he starts having worsening fevers suggest to send for repeat pancultures.  Please monitor CBC, CMP especially the LFTs while on antibiotics.  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 Thoracic component was not washed out. Repeat imaging 1/21 at the acute facility showed persistent infection in cervical and thoracic spine.  He underwent cervical laminectomy with abscess removal on  08/22/2020.    He has been on treatment with IV nafcillin. He will need 8 weeks of treatment for vertebral osteomyelitis with tentative end date 10/22/2020.  At this time started on Zosyn for the pneumonia.  Suggest to hold the nafcillin while on the Zosyn because the Zosyn should also cover for the MSSA.  Recommend to restart the nafcillin after completion of the Zosyn to complete the treatment duration. Unfortunately continues to be quadriplegic, low likelihood of good neurological recovery at this time.   Right upper extremity DVT: On Eliquis per the primary team.  Protein calorie malnutrition/dysphagia: Unfortunately due to his dysphagia he is high risk for aspiration and aspiration pneumonia despite being on antibiotics.  Management of protein calorie malnutrition per the primary team.  Urinary retention: From the quadriplegia.  He is status post suprapubic catheter placement.  Unfortunately due to the suprapubic catheter he is high risk for recurrent UTIs.  Continue local care of the catheter.  Due to his complex medical problems he is very high risk for worsening and decompensation. Plan of care discussed with the primary team and pharmacy.  Subjective: Patient awake and oriented.  Continues to have weakness with quadriplegia.  Objective: Vitals: Temperature 98.8, pulse 81, respiratory rate 18, blood pressure 140/83, pulse oximetry 98%  Examination: Constitutional:  Chronically ill-appearing male, awake and oriented Head: Atraumatic, normocephalic Eyes: PERLA, EOMI, irises appear normal, anicteric sclera,  ENMT: external ears and nose appear normal, normal hearing, Lips appears normal, moist oral mucosa Neck: Has a trach in place CVS: S1-S2  Respiratory: Decreased breath sounds  lower lobes, occasional rhonchi, no wheezing Abdomen: soft, positive bowel sounds  Musculoskeletal: He has quadriplegia with severe weakness of both upper and lower extremities, left upper extremity  swelling, no lower extremity edema Neuro: Quadriplegic Psych: stable mood and affect Skin: no rashes  Data Reviewed: I have personally reviewed following labs and imaging studies  CBC: No results for input(s): WBC, NEUTROABS, HGB, HCT, MCV, PLT in the last 168 hours.  Basic Metabolic Panel: No results for input(s): NA, K, CL, CO2, GLUCOSE, BUN, CREATININE, CALCIUM, MG, PHOS in the last 168 hours.  GFR: CrCl cannot be calculated (Unknown ideal weight.).  Liver Function Tests: No results for input(s): AST, ALT, ALKPHOS, BILITOT, PROT, ALBUMIN in the last 168 hours.  CBG: No results for input(s): GLUCAP in the last 168 hours.   No results found for this or any previous visit (from the past 240 hour(s)).    Radiology Studies: No results found.  Scheduled Meds: Please see MAR  Vonzella Nipple, MD  10/19/2020, 3:47 PM

## 2020-10-22 LAB — CBC
HCT: 23.9 % — ABNORMAL LOW (ref 39.0–52.0)
Hemoglobin: 7.6 g/dL — ABNORMAL LOW (ref 13.0–17.0)
MCH: 24.4 pg — ABNORMAL LOW (ref 26.0–34.0)
MCHC: 31.8 g/dL (ref 30.0–36.0)
MCV: 76.6 fL — ABNORMAL LOW (ref 80.0–100.0)
Platelets: 318 10*3/uL (ref 150–400)
RBC: 3.12 MIL/uL — ABNORMAL LOW (ref 4.22–5.81)
RDW: 22.8 % — ABNORMAL HIGH (ref 11.5–15.5)
WBC: 6.4 10*3/uL (ref 4.0–10.5)
nRBC: 0 % (ref 0.0–0.2)

## 2020-10-22 LAB — BASIC METABOLIC PANEL
Anion gap: 3 — ABNORMAL LOW (ref 5–15)
BUN: 11 mg/dL (ref 8–23)
CO2: 28 mmol/L (ref 22–32)
Calcium: 8.3 mg/dL — ABNORMAL LOW (ref 8.9–10.3)
Chloride: 112 mmol/L — ABNORMAL HIGH (ref 98–111)
Creatinine, Ser: 0.49 mg/dL — ABNORMAL LOW (ref 0.61–1.24)
GFR, Estimated: >60 mL/min (ref >60)
Glucose, Bld: 128 mg/dL — ABNORMAL HIGH (ref 70–99)
Potassium: 2.9 mmol/L — ABNORMAL LOW (ref 3.5–5.1)
Sodium: 143 mmol/L (ref 135–145)

## 2020-10-23 LAB — POTASSIUM: Potassium: 3.3 mmol/L — ABNORMAL LOW (ref 3.5–5.1)

## 2020-10-24 LAB — NOVEL CORONAVIRUS, NAA (HOSP ORDER, SEND-OUT TO REF LAB; TAT 18-24 HRS): SARS-CoV-2, NAA: NOT DETECTED

## 2020-10-24 LAB — POTASSIUM: Potassium: 3.6 mmol/L (ref 3.5–5.1)

## 2022-10-16 IMAGING — DX DG ABDOMEN 1V
1 series · 1 of 1 positions shown · non-contrast
Comparison: None.

CLINICAL DATA: Peg placement.

EXAM:
ABDOMEN - 1 VIEW

[abdomen kub]
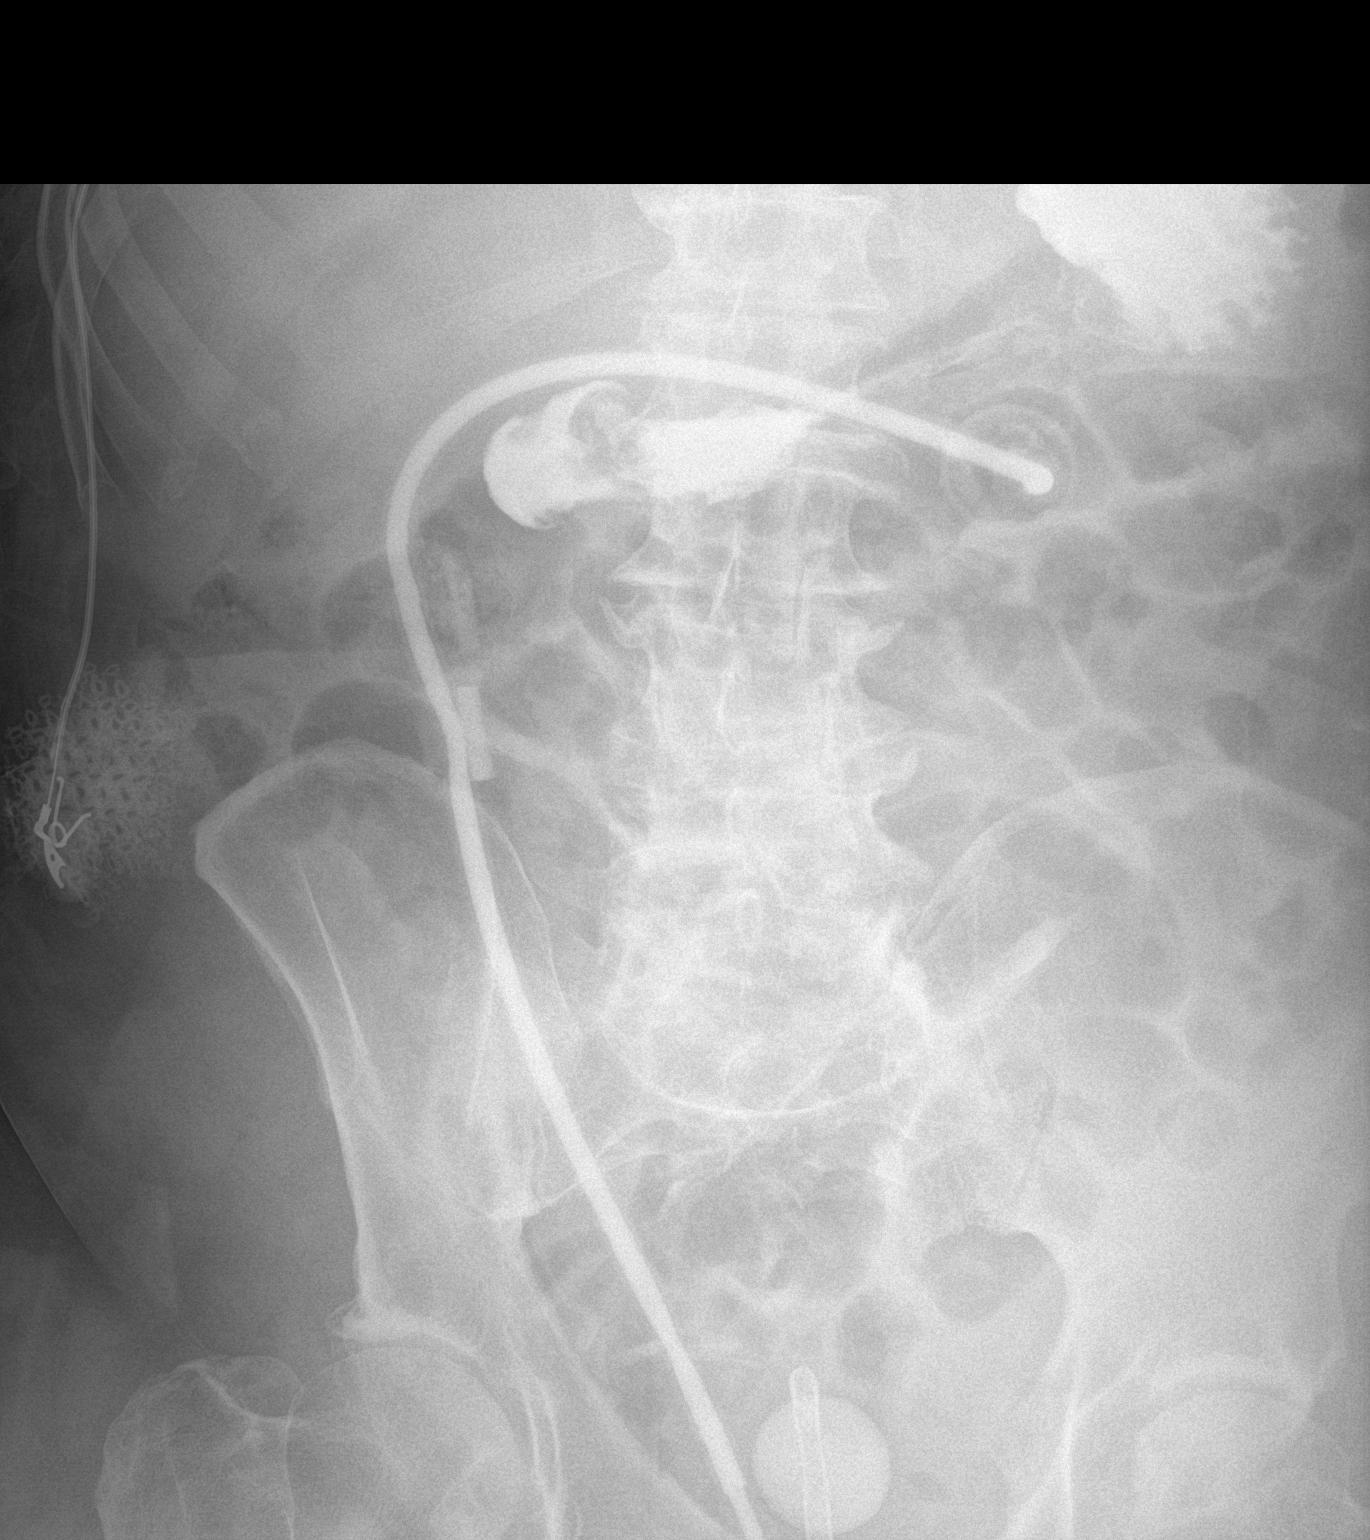

[1 of 1 positions shown; findings below may reference images not displayed]

FINDINGS: Portable AP supine view of the abdomen obtained after the
installation of contrast through indwelling gastrostomy tube. Type
and amount of contrast not specified. Contrast opacifies the
gastrostomy tubing and stomach. Contrast does not extend into the
duodenum or small bowel. There is no evidence of extravasation or
leak. Air within nondilated bowel throughout the central abdomen.
Foley catheter versus rectal tube projects over the pelvis.
IMPRESSION: Gastrostomy tube in the stomach. No evidence of extravasation or
leak.

## 2022-10-16 IMAGING — DX DG CHEST 1V PORT
1 series · 1 of 1 positions shown · non-contrast
Comparison: None.

CLINICAL DATA: New admission. No additional history provided or
available.

EXAM:
PORTABLE CHEST 1 VIEW

[chest ap]
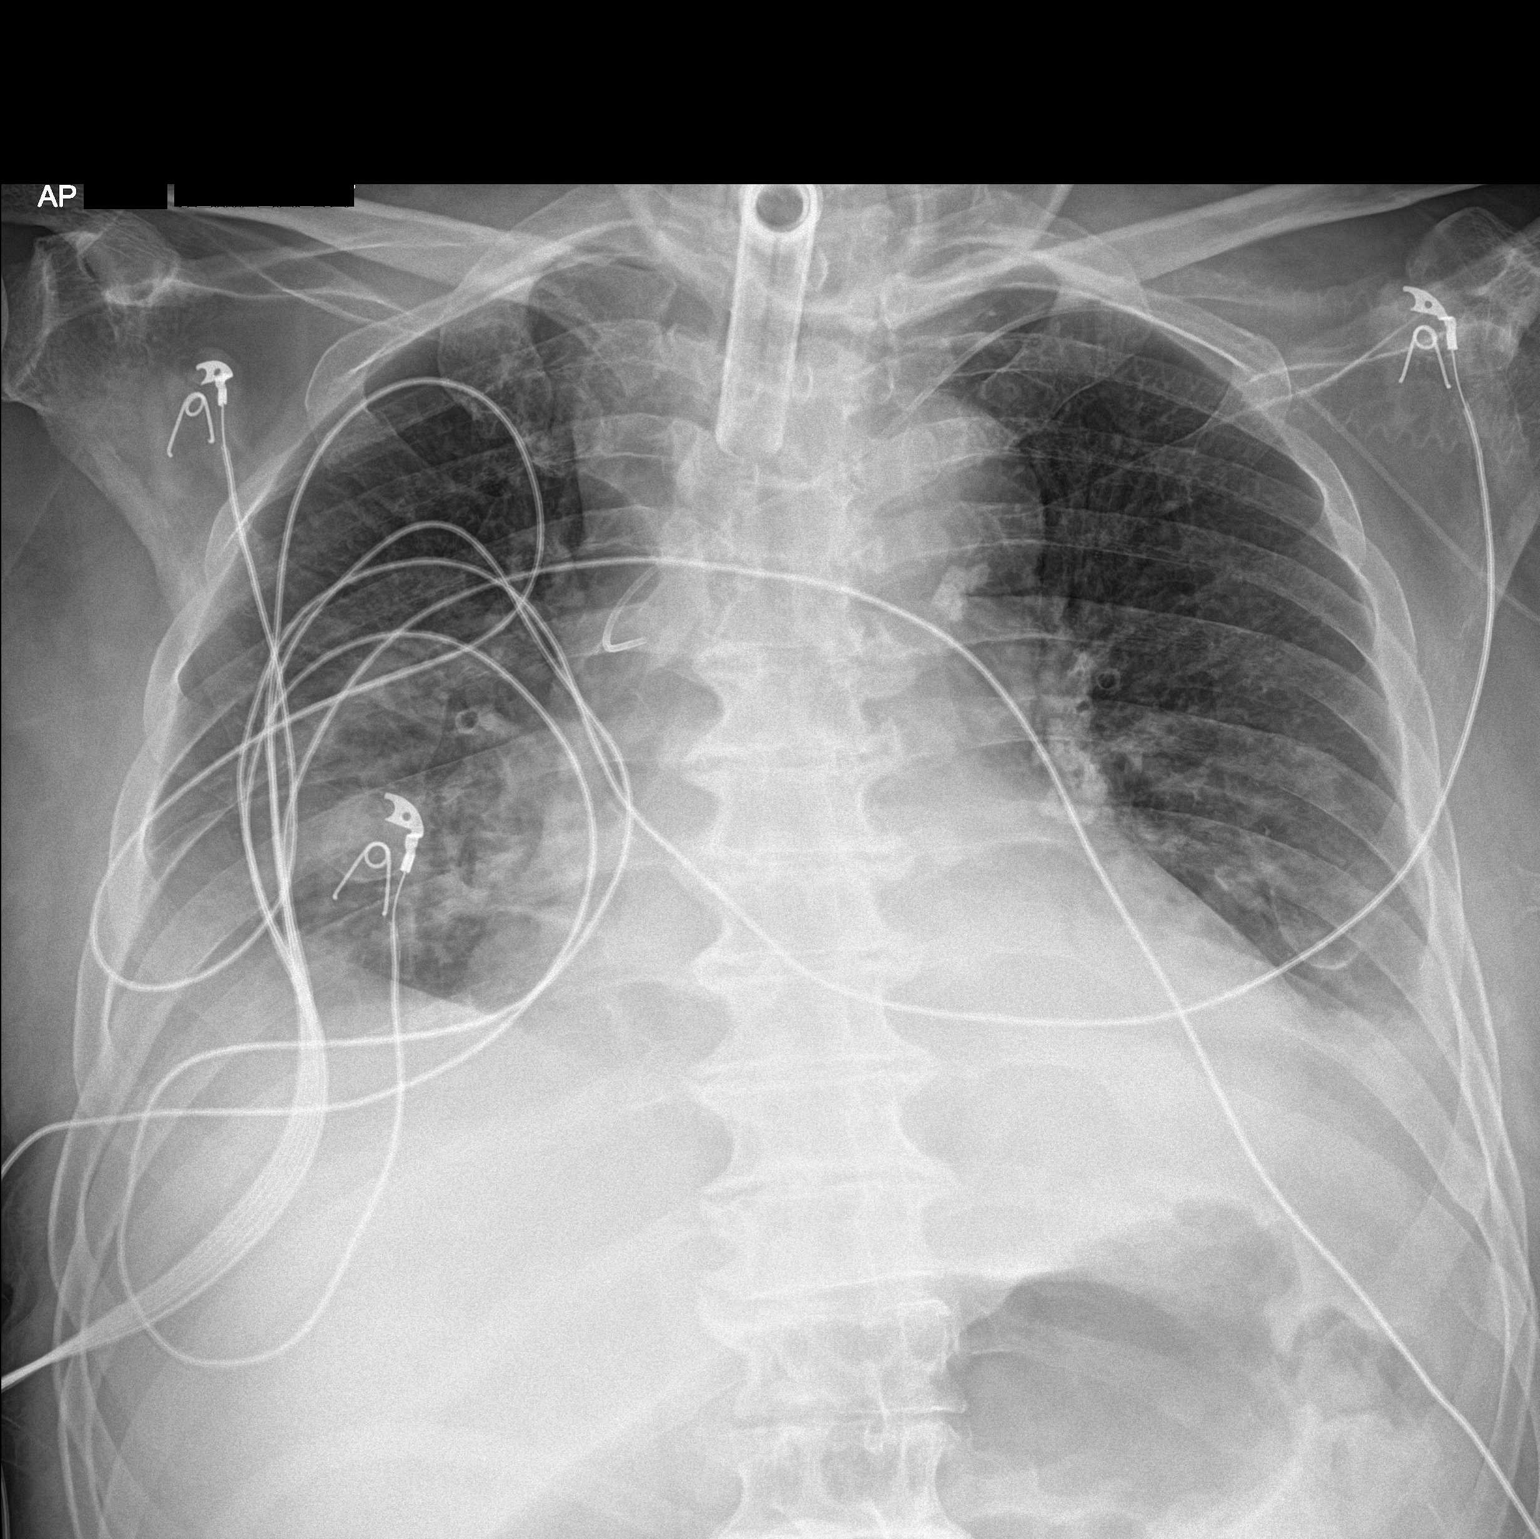

[1 of 1 positions shown; findings below may reference images not displayed]

FINDINGS: Tracheostomy tube tip at the thoracic inlet. There is a left upper
extremity PICC in place. The tip is looped in the region of the
brachiocephalic confluence. Overall low lung volumes. Cardiomegaly.
Calcified mediastinal and left hilar nodes. Calcified granuloma at
the left lung base. Hazy bilateral lung base opacities likely
combination of pleural effusions and airspace disease and/or
atelectasis. No pneumothorax. Thoracic spondylosis.
IMPRESSION: 1. Tracheostomy tube tip at the thoracic inlet.
2. Left upper extremity PICC in place, tip looped in the region of
the brachiocephalic confluence.
3. Cardiomegaly with hazy bilateral lung base opacities likely
combination of pleural effusions and airspace disease and/or
atelectasis.

## 2022-11-12 IMAGING — DX DG CHEST 1V PORT
1 series · 1 of 1 positions shown · non-contrast
Comparison: 09/15/2020

CLINICAL DATA: Respiratory failure.

EXAM:
PORTABLE CHEST 1 VIEW

[chest]
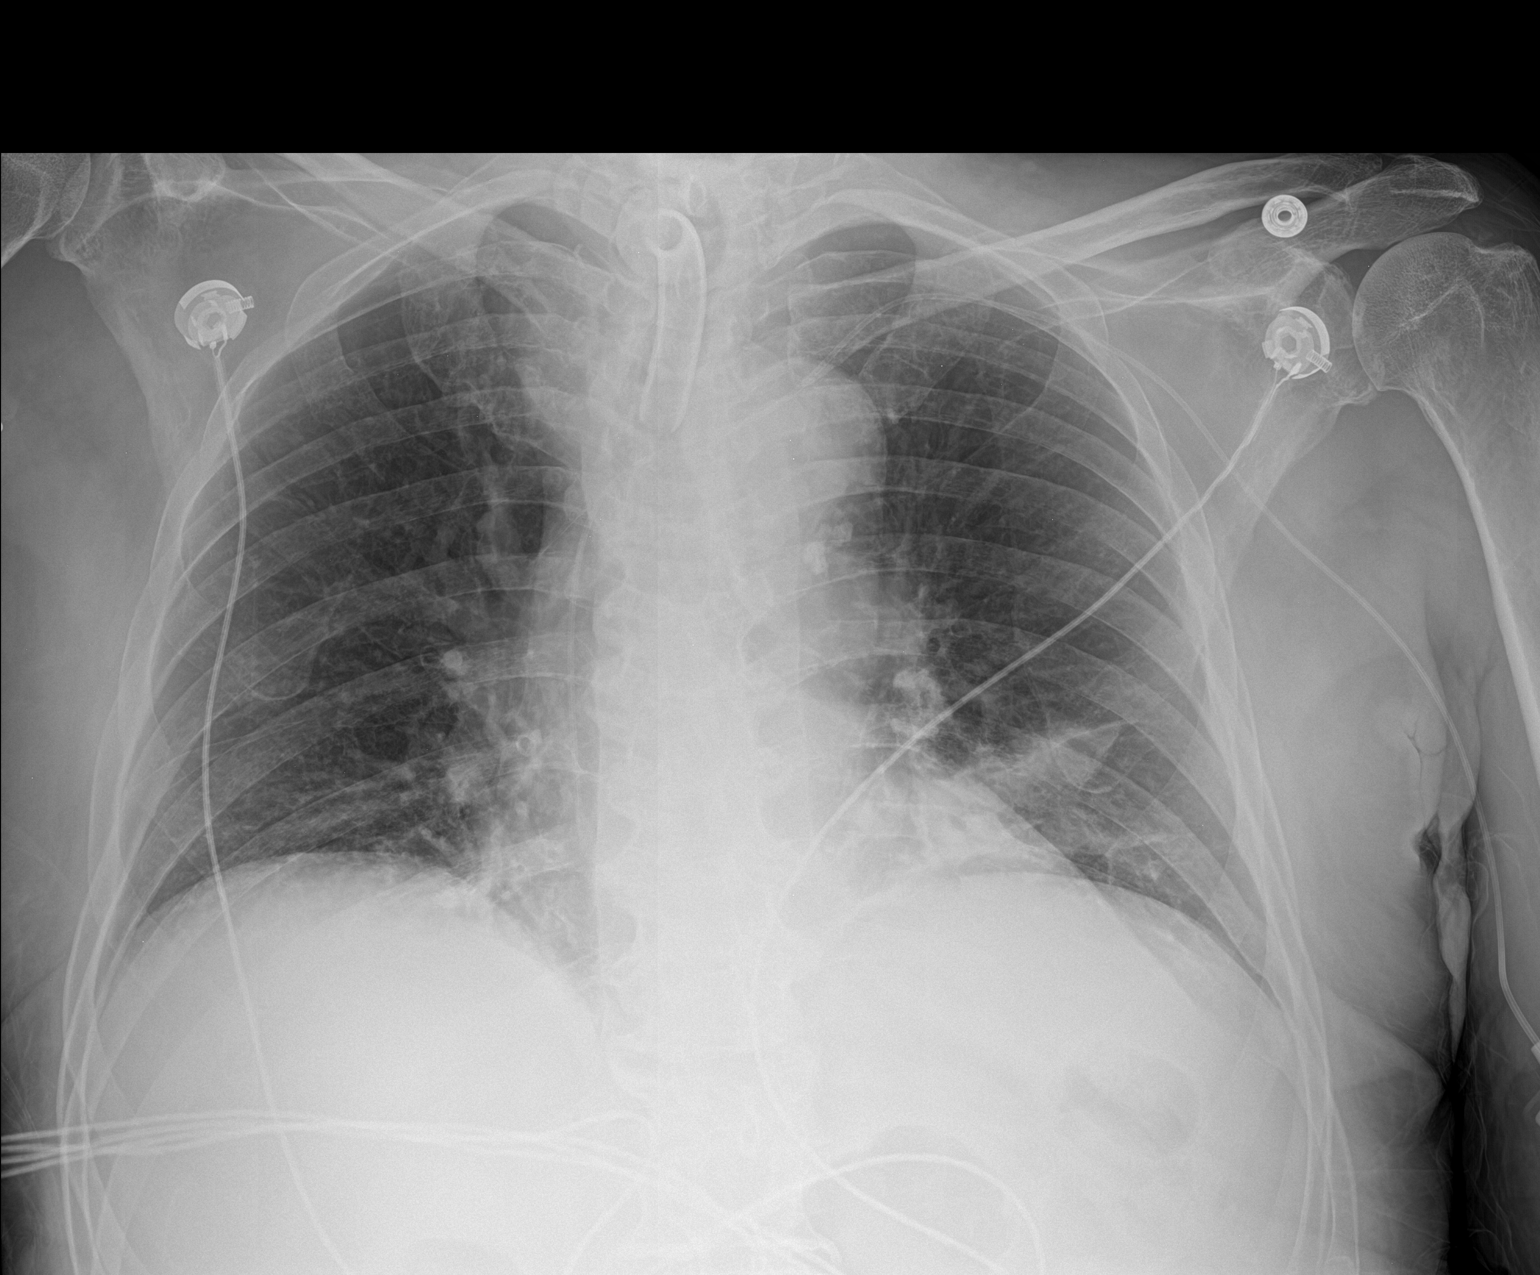

[1 of 1 positions shown; findings below may reference images not displayed]

FINDINGS: The tracheostomy tube is stable.

The left-sided PICC line is stable.

The cardiac silhouette, mediastinal and hilar contours are
unchanged.

Persistent streaky bibasilar opacity, likely scarring change. No new
infiltrates, pulmonary edema, pleural effusions or pneumothorax.
IMPRESSION: 1. Stable support apparatus.
2. Persistent bibasilar scarring changes. No acute overlying
pulmonary findings.
